# Patient Record
Sex: Male | Born: 1995 | Race: Black or African American | Hispanic: No | Marital: Single | Smoking: Current every day smoker
Health system: Southern US, Community
[De-identification: ages and names within clinical notes are randomized; demographics above are authoritative.]

## PROBLEM LIST (undated history)

## (undated) DIAGNOSIS — E669 Obesity, unspecified: Secondary | ICD-10-CM

## (undated) DIAGNOSIS — F259 Schizoaffective disorder, unspecified: Secondary | ICD-10-CM

---
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
## 2014-06-03 ENCOUNTER — Emergency Department (HOSPITAL_BASED_OUTPATIENT_CLINIC_OR_DEPARTMENT_OTHER)
Admission: EM | Admit: 2014-06-03 | Discharge: 2014-06-03 | Disposition: A | Discharge status: Home / Self Care | Payer: No Typology Code available for payment source | Attending: Emergency Medicine | Admitting: Emergency Medicine

## 2014-06-03 ENCOUNTER — Encounter (HOSPITAL_BASED_OUTPATIENT_CLINIC_OR_DEPARTMENT_OTHER): Payer: Self-pay | Admitting: Emergency Medicine

## 2014-06-03 DIAGNOSIS — Y9289 Other specified places as the place of occurrence of the external cause: Secondary | ICD-10-CM | POA: Insufficient documentation

## 2014-06-03 DIAGNOSIS — F17200 Nicotine dependence, unspecified, uncomplicated: Secondary | ICD-10-CM | POA: Insufficient documentation

## 2014-06-03 DIAGNOSIS — S61411A Laceration without foreign body of right hand, initial encounter: Secondary | ICD-10-CM

## 2014-06-03 DIAGNOSIS — Z79899 Other long term (current) drug therapy: Secondary | ICD-10-CM | POA: Insufficient documentation

## 2014-06-03 DIAGNOSIS — S61409A Unspecified open wound of unspecified hand, initial encounter: Secondary | ICD-10-CM | POA: Insufficient documentation

## 2014-06-03 DIAGNOSIS — Y9389 Activity, other specified: Secondary | ICD-10-CM | POA: Insufficient documentation

## 2014-06-03 DIAGNOSIS — W268XXA Contact with other sharp object(s), not elsewhere classified, initial encounter: Secondary | ICD-10-CM | POA: Insufficient documentation

## 2014-06-03 MED ORDER — LIDOCAINE HCL (PF) 1 % IJ SOLN
5.0000 mL | Freq: Once | INTRAMUSCULAR | Status: AC
  Administered 2014-06-03: 5 mL

## 2014-06-03 NOTE — Discharge Instructions (Signed)
Delayed Wound Closure Sometimes, your health care provider will decide to delay closing a wound for several days. This is done when the wound is badly bruised, dirty, or when it has been several hours since the injury happened. By delaying the closure of your wound, the risk of infection is reduced. Wounds that are closed in 3-7 days after being cleaned up and dressed heal just as well as those that are closed right away. HOME CARE INSTRUCTIONS  Rest and elevate the injured area until the pain and swelling are gone.  Have your wound checked as instructed by your health care provider. SEEK MEDICAL CARE IF:  You develop unusual or increased swelling or redness around the wound.  You have increasing pain or tenderness.  There is increasing fluid (drainage) or a bad smelling drainage coming from the wound.  You have fever, weakness or numbness, severe pain, or other concerns. Document Released: 10/20/2005 Document Revised: 10/25/2013 Document Reviewed: 04/19/2013 [facility] Patient Information 2015 [employer] [place]. This information is not intended to replace advice given to you by your health care provider. Make sure you discuss any questions you have with your health care provider.

## 2014-06-03 NOTE — ED Provider Notes (Signed)
CSN: [id number]     Arrival date & time 06/03/14  1623 History   First MD Initiated Contact with Patient 06/03/14 1957    This chart was scribed for No att. providers found by Marica OtterNusrat Rahman, ED Scribe. This patient was seen in room MHOTF/OTF and the patient's care was started at 7:59 PM.  Chief Complaint  Patient presents with  . Extremity Laceration   The history is provided by the patient. No language interpreter was used.   HPI Comments: Todd BanningColby Burton is a 18 y.o. male who presents to the Emergency Department complaining of a laceration to his right thumb sustained last night when he was trying to open a can of ravioli. Pt reports he is UTD on his tetanus shot. Pt denies fever, redness or puss. 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  History reviewed. No pertinent past medical history. History reviewed. No pertinent past surgical history. No family history on file. History  Substance Use Topics  . Smoking status: Current Every Day Smoker  . Smokeless tobacco: Not on file  . Alcohol Use: No    Review of Systems  See HPI  Allergies  Review of patient's allergies indicates no known allergies.  Home Medications   Prior to Admission medications   Medication Sig Start Date End Date Taking? Authorizing Provider  atomoxetine (STRATTERA) 10 MG capsule Take 10 mg by mouth daily.   Yes Historical Provider, MD  risperiDONE (RISPERDAL) 0.25 MG tablet Take 0.25 mg by mouth at bedtime.   Yes Historical Provider, MD   Triage Vitals: BP 155/87  Pulse 110  Temp(Src) 98.9 F (37.2 C) (Oral)  Resp 20  Ht 6' (1.829 m)  Wt 280 lb (127.007 kg)  BMI 37.97 kg/m2  SpO2 97%  Physical Exam  Nursing note and vitals reviewed. Constitutional:  Awake, alert, nontoxic appearance.  HENT:  Head: Atraumatic.  Eyes: Right eye exhibits no discharge. Left eye exhibits no discharge.  Neck: Neck supple.  Pulmonary/Chest: Effort normal. He  exhibits no tenderness.  Abdominal: Soft. There is no tenderness. There is no rebound.  Musculoskeletal: He exhibits no tenderness.  Baseline ROM, no obvious new focal weakness. Nontender right shoulder, elbow and wrist. Right hand cap refill less than 2 seconds all digits. Right thumb 2 pouint discrimination intact. Right thumb 5 out of 5 strength against resistant flexion and extension. Right thumb eminence, 2.5 cm laceration, local tenderness without erythema, fluctuance or puerile discharge.    Neurological:  Mental status and motor strength appears baseline for patient and situation.  Skin: No rash noted.  Psychiatric: He has a normal mood and affect.    ED Course  Procedures (including critical care time) DIAGNOSTIC STUDIES: Oxygen Saturation is 97% on RA, nl by my interpretation.    COORDINATION OF CARE: 8:04 PM-Discussed treatment plan which includes cleaning wound area, hand surgery referral and f/u and work note with pt at bedside. Patient verbalizes understanding and agrees with all of treatment plan except declines referral to hand surgery. [facility] since feel primary closure now will increase infection risk.  Labs Review Labs Reviewed - No data to display  Imaging Review No results found.   EKG Interpretation None      MDM   Final diagnoses:  Hand laceration, right, initial encounter   I doubt any other EMC precluding discharge at this time including, but not necessarily limited to the following:wound infection.  I personally  performed the services described in this documentation, which was scribed in my presence. The recorded information has been reviewed and is accurate.     Hurman Horn, MD 06/04/14 [phone]

## 2014-06-03 NOTE — ED Notes (Addendum)
Patient states he cut his right hand on a can last night. Laceration appx 2 inches. Bleeding controlled. Does not recall his last tetanus shot.

## 2014-11-05 ENCOUNTER — Encounter (HOSPITAL_BASED_OUTPATIENT_CLINIC_OR_DEPARTMENT_OTHER): Payer: Self-pay

## 2014-11-05 ENCOUNTER — Emergency Department (HOSPITAL_BASED_OUTPATIENT_CLINIC_OR_DEPARTMENT_OTHER)
Admission: EM | Admit: 2014-11-05 | Discharge: 2014-11-05 | Disposition: A | Discharge status: Home / Self Care | Payer: Medicaid Other | Attending: Emergency Medicine | Admitting: Emergency Medicine

## 2014-11-05 ENCOUNTER — Emergency Department (HOSPITAL_BASED_OUTPATIENT_CLINIC_OR_DEPARTMENT_OTHER): Payer: Medicaid Other

## 2014-11-05 DIAGNOSIS — S8992XA Unspecified injury of left lower leg, initial encounter: Secondary | ICD-10-CM | POA: Diagnosis present

## 2014-11-05 DIAGNOSIS — Y998 Other external cause status: Secondary | ICD-10-CM | POA: Insufficient documentation

## 2014-11-05 DIAGNOSIS — S8392XA Sprain of unspecified site of left knee, initial encounter: Secondary | ICD-10-CM | POA: Diagnosis not present

## 2014-11-05 DIAGNOSIS — W01198A Fall on same level from slipping, tripping and stumbling with subsequent striking against other object, initial encounter: Secondary | ICD-10-CM | POA: Insufficient documentation

## 2014-11-05 DIAGNOSIS — W19XXXA Unspecified fall, initial encounter: Secondary | ICD-10-CM

## 2014-11-05 DIAGNOSIS — Z720 Tobacco use: Secondary | ICD-10-CM | POA: Diagnosis not present

## 2014-11-05 DIAGNOSIS — Z79899 Other long term (current) drug therapy: Secondary | ICD-10-CM | POA: Diagnosis not present

## 2014-11-05 DIAGNOSIS — Y9289 Other specified places as the place of occurrence of the external cause: Secondary | ICD-10-CM | POA: Insufficient documentation

## 2014-11-05 DIAGNOSIS — F259 Schizoaffective disorder, unspecified: Secondary | ICD-10-CM | POA: Insufficient documentation

## 2014-11-05 DIAGNOSIS — Y9389 Activity, other specified: Secondary | ICD-10-CM | POA: Diagnosis not present

## 2014-11-05 HISTORY — DX: Schizoaffective disorder, unspecified: F25.9

## 2014-11-05 MED ORDER — IBUPROFEN 800 MG PO TABS
800.0000 mg | ORAL_TABLET | Freq: Once | ORAL | Status: DC
  Filled 2014-11-05: qty 1

## 2014-11-05 NOTE — ED Notes (Signed)
Per nurse request, I completed ace wrap to patient left knee.

## 2014-11-05 NOTE — ED Notes (Addendum)
Pt states that he had taken Ibuprofen in the past 3 hours.  Dose not given that was ordered at d/c home.

## 2014-11-05 NOTE — ED Notes (Signed)
Pt c/o left knee pain after falling 11/03/13 - reports edema, painful ambulation.

## 2014-11-05 NOTE — ED Provider Notes (Signed)
CSN: [id number]     Arrival date & time 11/05/14  0134 History   First MD Initiated Contact with Patient 11/05/14 0302     Chief Complaint  Patient presents with  . Knee Pain    Patient is a 19 y.o. male presenting with knee pain. The history is provided by the patient.  Knee Pain Location:  Knee Time since incident:  2 days Knee location:  L knee Pain details:    Quality:  Aching   Severity:  Moderate   Onset quality:  Gradual   Duration:  2 days   Timing:  Intermittent   Progression:  Worsening Relieved by:  Rest Worsened by:  Activity Associated symptoms: no back pain   pt reports he fell on left knee about 2 days ago "while messing around" No other injury reported  Past Medical History  Diagnosis Date  . Schizoaffective disorder    History reviewed. No pertinent past surgical history. History reviewed. No pertinent family history. History  Substance Use Topics  . Smoking status: Current Every Day Smoker  . Smokeless tobacco: Not on file  . Alcohol Use: Yes     Comment: weekend    Review of Systems  Musculoskeletal: Positive for arthralgias. Negative for back pain.  Neurological: Negative for weakness.      Allergies  Review of patient's allergies indicates no known allergies.  Home Medications   Prior to Admission medications   Medication Sig Start Date End Date Taking? Authorizing Provider  benztropine (COGENTIN) 2 MG tablet Take 2 mg by mouth 2 (two) times daily.   Yes Historical Provider, MD  fluorescein-benoxinate (FLURATE) ophthalmic solution 1 drop once.   Yes Historical Provider, MD  FLUoxetine (PROZAC) 10 MG capsule Take 10 mg by mouth daily.   Yes Historical Provider, MD  risperiDONE (RISPERDAL) 0.25 MG tablet Take 0.25 mg by mouth at bedtime.   Yes Historical Provider, MD  atomoxetine (STRATTERA) 10 MG capsule Take 10 mg by mouth daily.    Historical Provider, MD   BP 119/96 mmHg  Pulse 120  Temp(Src) 98.7 F (37.1 C) (Oral)  Resp 17  Ht 5'  11" (1.803 m)  Wt 280 lb (127.007 kg)  BMI 39.07 kg/m2  SpO2 98% Physical Exam CONSTITUTIONAL: Well developed/well nourished HEAD: Normocephalic/atraumatic ENMT: Mucous membranes moist NECK: supple no meningeal signs CV: S1/S2 noted, no murmurs/rubs/gallops noted LUNGS: Lungs are clear to auscultation bilaterally, no apparent distress ABDOMEN: soft, nontender, no rebound or guarding, bowel sounds noted throughout abdomen NEURO: Pt is awake/alert/appropriate, moves all extremitiesx4.  No facial droop.   EXTREMITIES: pulses normal/equal, full ROM, no tenderness to palpation of left knee. No deformity.  No tenderness to left calf/ankle.  He reports mild pain when flexing left knee SKIN: warm, color normal  ED Course  Procedures  Imaging Review Dg Knee Complete 4 Views Left  11/05/2014   CLINICAL DATA:  Patient fell 2 days ago striking the knee to the ground. Knee pain all over. Difficulty moving.  EXAM: LEFT KNEE - COMPLETE 4+ VIEW  COMPARISON:  None.  FINDINGS: There is no evidence of fracture, dislocation, or joint effusion. There is no evidence of arthropathy or other focal bone abnormality. Soft tissues are unremarkable.  IMPRESSION: Negative.   Electronically Signed   By: Burman Nieves M.D.   On: 11/05/2014 02:30    MDM   Final diagnoses:  Sprain of left knee, initial encounter    Nursing notes including past medical history and social history reviewed and  considered in documentation xrays/imaging reviewed by myself and considered during evaluation     Joya Gaskins, MD 11/05/14 [phone]

## 2016-06-27 ENCOUNTER — Encounter (HOSPITAL_BASED_OUTPATIENT_CLINIC_OR_DEPARTMENT_OTHER): Payer: Self-pay | Admitting: *Deleted

## 2016-06-27 ENCOUNTER — Inpatient Hospital Stay (HOSPITAL_BASED_OUTPATIENT_CLINIC_OR_DEPARTMENT_OTHER)
Admission: EM | Admit: 2016-06-27 | Discharge: 2016-07-02 | DRG: 872 | Disposition: A | Discharge status: Home / Self Care | Payer: Medicaid Other | Attending: Family Medicine | Admitting: Family Medicine

## 2016-06-27 ENCOUNTER — Emergency Department (HOSPITAL_BASED_OUTPATIENT_CLINIC_OR_DEPARTMENT_OTHER): Payer: Medicaid Other

## 2016-06-27 DIAGNOSIS — F17200 Nicotine dependence, unspecified, uncomplicated: Secondary | ICD-10-CM | POA: Diagnosis present

## 2016-06-27 DIAGNOSIS — R064 Hyperventilation: Secondary | ICD-10-CM

## 2016-06-27 DIAGNOSIS — R0682 Tachypnea, not elsewhere classified: Secondary | ICD-10-CM | POA: Diagnosis present

## 2016-06-27 DIAGNOSIS — R945 Abnormal results of liver function studies: Secondary | ICD-10-CM | POA: Diagnosis present

## 2016-06-27 DIAGNOSIS — N390 Urinary tract infection, site not specified: Secondary | ICD-10-CM | POA: Diagnosis present

## 2016-06-27 DIAGNOSIS — E860 Dehydration: Secondary | ICD-10-CM | POA: Diagnosis present

## 2016-06-27 DIAGNOSIS — Z6841 Body Mass Index (BMI) 40.0 and over, adult: Secondary | ICD-10-CM

## 2016-06-27 DIAGNOSIS — R21 Rash and other nonspecific skin eruption: Secondary | ICD-10-CM | POA: Diagnosis present

## 2016-06-27 DIAGNOSIS — F259 Schizoaffective disorder, unspecified: Secondary | ICD-10-CM | POA: Diagnosis present

## 2016-06-27 DIAGNOSIS — Z79899 Other long term (current) drug therapy: Secondary | ICD-10-CM

## 2016-06-27 DIAGNOSIS — R6510 Systemic inflammatory response syndrome (SIRS) of non-infectious origin without acute organ dysfunction: Secondary | ICD-10-CM | POA: Diagnosis present

## 2016-06-27 DIAGNOSIS — E876 Hypokalemia: Secondary | ICD-10-CM | POA: Diagnosis present

## 2016-06-27 DIAGNOSIS — R1111 Vomiting without nausea: Secondary | ICD-10-CM

## 2016-06-27 DIAGNOSIS — R740 Nonspecific elevation of levels of transaminase and lactic acid dehydrogenase [LDH]: Secondary | ICD-10-CM | POA: Diagnosis present

## 2016-06-27 DIAGNOSIS — D649 Anemia, unspecified: Secondary | ICD-10-CM | POA: Diagnosis present

## 2016-06-27 DIAGNOSIS — G4733 Obstructive sleep apnea (adult) (pediatric): Secondary | ICD-10-CM | POA: Diagnosis present

## 2016-06-27 DIAGNOSIS — N3001 Acute cystitis with hematuria: Secondary | ICD-10-CM

## 2016-06-27 DIAGNOSIS — R809 Proteinuria, unspecified: Secondary | ICD-10-CM | POA: Diagnosis present

## 2016-06-27 DIAGNOSIS — A419 Sepsis, unspecified organism: Principal | ICD-10-CM | POA: Diagnosis present

## 2016-06-27 DIAGNOSIS — R000 Tachycardia, unspecified: Secondary | ICD-10-CM | POA: Diagnosis present

## 2016-06-27 DIAGNOSIS — B2790 Infectious mononucleosis, unspecified without complication: Secondary | ICD-10-CM | POA: Diagnosis present

## 2016-06-27 HISTORY — DX: Obesity, unspecified: E66.9

## 2016-06-27 LAB — COMPREHENSIVE METABOLIC PANEL
ALBUMIN: 2.9 g/dL — AB (ref 3.5–5.0)
ALK PHOS: 58 U/L (ref 38–126)
ALT: 72 U/L — AB (ref 17–63)
ANION GAP: 8 (ref 5–15)
AST: 109 U/L — ABNORMAL HIGH (ref 15–41)
BILIRUBIN TOTAL: 1.2 mg/dL (ref 0.3–1.2)
BUN: 9 mg/dL (ref 6–20)
CHLORIDE: 99 mmol/L — AB (ref 101–111)
CO2: 25 mmol/L (ref 22–32)
Calcium: 8.2 mg/dL — ABNORMAL LOW (ref 8.9–10.3)
Creatinine, Ser: 1.31 mg/dL — ABNORMAL HIGH (ref 0.61–1.24)
GFR calc non Af Amer: >60 mL/min (ref >60)
GLUCOSE: 117 mg/dL — AB (ref 65–99)
Potassium: 3.5 mmol/L (ref 3.5–5.1)
Sodium: 132 mmol/L — ABNORMAL LOW (ref 135–145)
Total Protein: 7.3 g/dL (ref 6.5–8.1)

## 2016-06-27 LAB — CBC WITH DIFFERENTIAL/PLATELET
BAND NEUTROPHILS: 1 %
BASOS ABS: 0 10*3/uL (ref 0.0–0.1)
BASOS PCT: 0 %
EOS PCT: 2 %
Eosinophils Absolute: 0.3 10*3/uL (ref 0.0–0.7)
HEMATOCRIT: 38.4 % — AB (ref 39.0–52.0)
HEMOGLOBIN: 12.9 g/dL — AB (ref 13.0–17.0)
LYMPHS PCT: 48 %
Lymphs Abs: 7.8 10*3/uL — ABNORMAL HIGH (ref 0.7–4.0)
MCH: 31.6 pg (ref 26.0–34.0)
MCHC: 33.6 g/dL (ref 30.0–36.0)
MCV: 94.1 fL (ref 78.0–100.0)
MONO ABS: 0.6 10*3/uL (ref 0.1–1.0)
MONOS PCT: 4 %
NEUTROS PCT: 45 %
Neutro Abs: 7.4 10*3/uL (ref 1.7–7.7)
Platelets: 233 10*3/uL (ref 150–400)
RBC: 4.08 MIL/uL — ABNORMAL LOW (ref 4.22–5.81)
RDW: 12.3 % (ref 11.5–15.5)
WBC: 16.1 10*3/uL — ABNORMAL HIGH (ref 4.0–10.5)

## 2016-06-27 LAB — URINE MICROSCOPIC-ADD ON

## 2016-06-27 LAB — URINALYSIS, ROUTINE W REFLEX MICROSCOPIC
Glucose, UA: NEGATIVE mg/dL
Ketones, ur: 15 mg/dL — AB
NITRITE: POSITIVE — AB
PH: 6 (ref 5.0–8.0)
Protein, ur: >300 mg/dL — AB
SPECIFIC GRAVITY, URINE: 1.025 (ref 1.005–1.030)

## 2016-06-27 LAB — I-STAT CG4 LACTIC ACID, ED
LACTIC ACID, VENOUS: 1.78 mmol/L (ref 0.5–1.9)
Lactic Acid, Venous: 1.28 mmol/L (ref 0.5–1.9)

## 2016-06-27 LAB — RAPID STREP SCREEN (MED CTR MEBANE ONLY): STREPTOCOCCUS, GROUP A SCREEN (DIRECT): NEGATIVE

## 2016-06-27 MED ORDER — ONDANSETRON HCL 4 MG/2ML IJ SOLN
4.0000 mg | Freq: Once | INTRAMUSCULAR | Status: AC
  Administered 2016-06-27: 4 mg via INTRAVENOUS
  Filled 2016-06-27: qty 2

## 2016-06-27 MED ORDER — SODIUM CHLORIDE 0.9 % IV BOLUS (SEPSIS)
1000.0000 mL | Freq: Once | INTRAVENOUS | Status: AC
  Administered 2016-06-27: 1000 mL via INTRAVENOUS

## 2016-06-27 MED ORDER — ONDANSETRON HCL 4 MG/2ML IJ SOLN
4.0000 mg | Freq: Four times a day (QID) | INTRAMUSCULAR | Status: DC | PRN
  Administered 2016-06-29: 4 mg via INTRAVENOUS
  Filled 2016-06-27: qty 2

## 2016-06-27 MED ORDER — IBUPROFEN 800 MG PO TABS
800.0000 mg | ORAL_TABLET | Freq: Once | ORAL | Status: AC
  Administered 2016-06-27: 800 mg via ORAL
  Filled 2016-06-27: qty 1

## 2016-06-27 MED ORDER — DEXTROSE 5 % IV SOLN
1.0000 g | Freq: Once | INTRAVENOUS | Status: AC
  Administered 2016-06-27: 1 g via INTRAVENOUS
  Filled 2016-06-27: qty 10

## 2016-06-27 MED ORDER — POTASSIUM CHLORIDE IN NACL 20-0.9 MEQ/L-% IV SOLN
INTRAVENOUS | Status: DC
  Administered 2016-06-28 – 2016-07-01 (×7): via INTRAVENOUS
  Filled 2016-06-27 (×10): qty 1000

## 2016-06-27 MED ORDER — ACETAMINOPHEN 325 MG PO TABS
650.0000 mg | ORAL_TABLET | Freq: Once | ORAL | Status: AC
  Administered 2016-06-27: 650 mg via ORAL
  Filled 2016-06-27: qty 2

## 2016-06-27 MED ORDER — IOPAMIDOL (ISOVUE-300) INJECTION 61%
100.0000 mL | Freq: Once | INTRAVENOUS | Status: AC | PRN
  Administered 2016-06-27: 100 mL via INTRAVENOUS

## 2016-06-27 NOTE — Progress Notes (Signed)
Patient ID: Todd Burton, male   DOB: October 01, 1996, 20 y.o.   MRN: [id number] Accepted to a telemetry bed/observation for evaluation and treatment of leukocytosis, urinary symptoms and tachycardia.   Please call the floor manager at extension [phone] for admitting physician assignment once the patient arrives to the medical unit.   Per [facility]  Chief Complaint    Chief Complaint  Patient presents with  . Emesis    HPI Todd Burton is a 20 y.o. male.  HPI   Todd Burton is a 20 y.o. male, with a history of Schizoaffective disorder, presenting to the ED with nonbilious, nonbloody vomiting for the last two weeks. Pt has had emesis about once or twice a day during this time period. States it will happen seemingly randomly, but states it tends to happen if he eats a large meal. No emesis today. Endorses dark urine and frequency. Last food intake was "a few bites of a burger" just prior to arrival. Pt states, "I had to stop eating because I felt like I was about to throw up."  Denies nausea, abdominal pain, known fever, or any other complaints.    Component Value Units  I-Stat CG4 Lactic Acid, ED (not at [facility]) [[id number]] Collected: 06/27/16 2010  Updated: 06/27/16 2017   Specimen Type: Blood    Lactic Acid, Venous 1.28 mmol/L  Culture, group A strep [[id number]] Collected: 06/27/16 1615  Updated: 06/27/16 1846   Urine culture [[id number]] Collected: 06/27/16 1320  Updated: 06/27/16 1842   Specimen Type: Urine   Specimen Source: Urine, Clean Catch   Blood Culture (routine x 2) [[id number]] Collected: 06/27/16 1500  Updated: 06/27/16 1838   Specimen Type: Blood   Blood Culture (routine x 2) [[id number]] Collected: 06/27/16 1520  Updated: 06/27/16 1838   Specimen Type: Blood   Rapid strep screen [[id number]] Collected: 06/27/16 1615  Updated: 06/27/16 1653   Specimen Type: Other    Streptococcus, Group A Screen (Direct) NEGATIVE  I-Stat CG4 Lactic Acid, ED (not at [facility]) [[id number]]  Collected: 06/27/16 1517  Updated: 06/27/16 1519   Specimen Type: Blood    Lactic Acid, Venous 1.78 mmol/L  Pathologist smear review [[id number]] Collected: 06/27/16 1250  Updated: 06/27/16 1448   CBC with Differential [[id number]] (Abnormal) Collected: 06/27/16 1250  Updated: 06/27/16 1443   Specimen Type: Blood    WBC 16.1 (H) K/uL   RBC 4.08 (L) MIL/uL   Hemoglobin 12.9 (L) g/dL   HCT 63.8 (L) %   MCV 94.1 fL   MCH 31.6 pg   MCHC 33.6 g/dL   RDW 93.7 %   Platelets 233 K/uL   Neutrophils Relative % 45 %   Lymphocytes Relative 48 %   Monocytes Relative 4 %   Eosinophils Relative 2 %   Basophils Relative 0 %   Band Neutrophils 1 %   Neutro Abs 7.4 K/uL   Lymphs Abs 7.8 (H) K/uL   Monocytes Absolute 0.6 K/uL   Eosinophils Absolute 0.3 K/uL   Basophils Absolute 0.0 K/uL   WBC Morphology ATYPICAL LYMPHOCYTES   Smear Review LARGE PLATELETS PRESENT  Comprehensive metabolic panel [[id number]] (Abnormal) Collected: 06/27/16 1250  Updated: 06/27/16 1411   Specimen Type: Blood    Sodium 132 (L) mmol/L   Potassium 3.5 mmol/L   Chloride 99 (L) mmol/L   CO2 25 mmol/L   Glucose, Bld 117 (H) mg/dL   BUN 9 mg/dL   Creatinine, Ser 1.15 (H) mg/dL   Calcium 8.2 (L) mg/dL   Total Protein 7.3 g/dL  Albumin 2.9 (L) g/dL   AST 914109 (H) U/L   ALT 72 (H) U/L   Alkaline Phosphatase 58 U/L   Total Bilirubin 1.2 mg/dL   GFR calc non Af Amer >60 mL/min   GFR calc Af Amer >60 mL/min   Anion gap 8  Urine microscopic-add on [[id number]][[id number]] (Abnormal) Collected: 06/27/16 1320  Updated: 06/27/16 1356    Squamous Epithelial / LPF 0-5 (A)   WBC, UA 0-5 WBC/hpf   RBC / HPF 0-5 RBC/hpf   Bacteria, UA MANY (A)   Urine-Other MUCOUS PRESENT  Urinalysis, Routine w reflex microscopic (not at [facility]) [[id number]] (Abnormal) Collected: 06/27/16 1320  Updated: 06/27/16 1356   Specimen Type: Urine    Color, Urine ORANGE (A)   APPearance CLEAR   Specific Gravity, Urine 1.025   pH 6.0   Glucose, UA NEGATIVE mg/dL    Hgb urine dipstick MODERATE (A)   Bilirubin Urine MODERATE (A)   Ketones, ur 15 (A) mg/dL   Protein, ur >846>300 (A) mg/dL   Nitrite POSITIVE (A)   Leukocytes, UA SMALL (A)   CLINICAL DATA:  Sepsis with cough  EXAM: CHEST  2 VIEW  COMPARISON:  None.  FINDINGS: Heart and mediastinal contours are within normal limits. No focal opacities or effusions. No acute bony abnormality.  IMPRESSION: No active cardiopulmonary disease.  CT scan abdomen and pelvis with contrast  IMPRESSION: 1. No acute abdominal pelvic findings. 2. Mildly prominent retroperitoneal and liac lymph nodes are not pathologic by size criteria and favored benign.   Todd Kleinavid Maevis Mumby, MD [phone].

## 2016-06-27 NOTE — ED Notes (Signed)
Pt has been given 5L NS.  2 of the 5 are still hanging during transfer

## 2016-06-27 NOTE — ED Notes (Signed)
Pt given gingerale for PO challenge 

## 2016-06-27 NOTE — ED Provider Notes (Signed)
[facility] Note   CSN: [id number] Arrival date & time: 06/27/16  1231     History   Chief Complaint Chief Complaint  Patient presents with  . Emesis    HPI Todd Burton is a 20 y.o. male.  HPI   Todd Burton is a 20 y.o. male, with a history of Schizoaffective disorder, presenting to the ED with nonbilious, nonbloody vomiting for the last two weeks. Pt has had emesis about once or twice a day during this time period. States it will happen seemingly randomly, but states it tends to happen if he eats a large meal. No emesis today. Endorses dark urine and frequency. Last food intake was "a few bites of a burger" just prior to arrival. Pt states, "I had to stop eating because I felt like I was about to throw up."  Denies nausea, abdominal pain, known fever, or any other complaints.     Past Medical History:  Diagnosis Date  . Schizoaffective disorder (HCC)     There are no active problems to display for this patient.   History reviewed. No pertinent surgical history.     Home Medications    Prior to Admission medications   Medication Sig Start Date End Date Taking? Authorizing Provider  benztropine (COGENTIN) 2 MG tablet Take 2 mg by mouth 2 (two) times daily.   Yes Historical Provider, MD  FLUoxetine (PROZAC) 10 MG capsule Take 10 mg by mouth daily.   Yes Historical Provider, MD  topiramate (TOPAMAX) 25 MG tablet Take 25 mg by mouth 2 (two) times daily.   Yes Historical Provider, MD  trazodone (DESYREL) 300 MG tablet Take 300 mg by mouth at bedtime.   Yes Historical Provider, MD  atomoxetine (STRATTERA) 10 MG capsule Take 10 mg by mouth daily.    Historical Provider, MD  fluorescein-benoxinate (FLURATE) ophthalmic solution 1 drop once.    Historical Provider, MD  risperiDONE (RISPERDAL) 0.25 MG tablet Take 0.25 mg by mouth at bedtime.    Historical Provider, MD    Family History No family history on file.  Social History Social History    Substance Use Topics  . Smoking status: Current Every Day Smoker  . Smokeless tobacco: Never Used  . Alcohol use Yes     Comment: weekend     Allergies   Review of patient's allergies indicates no known allergies.   Review of Systems Review of Systems  Constitutional: Negative for chills and diaphoresis.  Respiratory: Negative for cough and shortness of breath.   Cardiovascular: Negative for chest pain.  Gastrointestinal: Positive for vomiting. Negative for abdominal pain, blood in stool, constipation, diarrhea and nausea.  All other systems reviewed and are negative.    Physical Exam Updated Vital Signs BP 126/86   Pulse (!) 138   Temp 99.5 F (37.5 C) (Oral)   Resp 22   Ht 6' (1.829 m)   Wt 127 kg   SpO2 93%   BMI 37.97 kg/m   Physical Exam  Constitutional: He appears well-developed and well-nourished. No distress.  HENT:  Head: Normocephalic and atraumatic.  Eyes: Conjunctivae are normal.  Neck: Normal range of motion. Neck supple.  Cardiovascular: Regular rhythm, normal heart sounds and intact distal pulses.  Tachycardia present.   Pulmonary/Chest: Breath sounds normal. Tachypnea noted.  Abdominal: Soft. Bowel sounds are normal. There is no tenderness. There is no guarding and no CVA tenderness.  Musculoskeletal: He exhibits no edema or tenderness.  Lymphadenopathy:    He has  no cervical adenopathy.  Neurological: He is alert.  Skin: Skin is warm and dry. He is not diaphoretic.  Psychiatric: He has a normal mood and affect. His behavior is normal.  Nursing note and vitals reviewed.    ED Treatments / Results  Labs (all labs ordered are listed, but only abnormal results are displayed) Labs Reviewed  URINALYSIS, ROUTINE W REFLEX MICROSCOPIC (NOT AT [facility]) - Abnormal; Notable for the following:       Result Value   Color, Urine ORANGE (*)    Hgb urine dipstick MODERATE (*)    Bilirubin Urine MODERATE (*)    Ketones, ur 15 (*)    Protein, ur >300 (*)     Nitrite POSITIVE (*)    Leukocytes, UA SMALL (*)    All other components within normal limits  COMPREHENSIVE METABOLIC PANEL - Abnormal; Notable for the following:    Sodium 132 (*)    Chloride 99 (*)    Glucose, Bld 117 (*)    Creatinine, Ser 1.31 (*)    Calcium 8.2 (*)    Albumin 2.9 (*)    AST 109 (*)    ALT 72 (*)    All other components within normal limits  CBC WITH DIFFERENTIAL/PLATELET - Abnormal; Notable for the following:    WBC 16.1 (*)    RBC 4.08 (*)    Hemoglobin 12.9 (*)    HCT 38.4 (*)    Lymphs Abs 7.8 (*)    All other components within normal limits  URINE MICROSCOPIC-ADD ON - Abnormal; Notable for the following:    Squamous Epithelial / LPF 0-5 (*)    Bacteria, UA MANY (*)    All other components within normal limits  RAPID STREP SCREEN (NOT AT [facility])  CULTURE, BLOOD (ROUTINE X 2)  CULTURE, BLOOD (ROUTINE X 2)  URINE CULTURE  CULTURE, GROUP A STREP ([facility])  PATHOLOGIST SMEAR REVIEW  I-STAT CG4 LACTIC ACID, ED  I-STAT CG4 LACTIC ACID, ED    EKG  EKG Interpretation None       Radiology Dg Chest 2 View  Result Date: 06/27/2016 CLINICAL DATA:  Sepsis with cough EXAM: CHEST  2 VIEW COMPARISON:  None. FINDINGS: Heart and mediastinal contours are within normal limits. No focal opacities or effusions. No acute bony abnormality. IMPRESSION: No active cardiopulmonary disease. Electronically Signed   By: Charlett Nose M.D.   On: 06/27/2016 14:57    Procedures Procedures (including critical care time)  Medications Ordered in ED Medications  sodium chloride 0.9 % bolus 1,000 mL (1,000 mLs Intravenous New Bag/Given 06/27/16 1402)  ondansetron (ZOFRAN) injection 4 mg (4 mg Intravenous Given 06/27/16 1355)  acetaminophen (TYLENOL) tablet 650 mg (650 mg Oral Given 06/27/16 1502)  cefTRIAXone (ROCEPHIN) 1 g in dextrose 5 % 50 mL IVPB (0 g Intravenous Stopped 06/27/16 1547)  sodium chloride 0.9 % bolus 1,000 mL (1,000 mLs Intravenous New Bag/Given 06/27/16 1545)      Initial Impression / Assessment and Plan / ED Course  I have reviewed the triage vital signs and the nursing notes.  Pertinent labs & imaging results that were available during my care of the patient were reviewed by me and considered in my medical decision making (see chart for details).  Clinical Course    Todd Burton presents with repeated vomiting over the past 2 weeks.  Findings and plan of care discussed with Arby Barrette, MD. Dr. Donnald Garre personally evaluated and examined this patient.  Patient with signs of sepsis. Sepsis protocol  initiated. I suspect patient may be a poor historian. For this reason, we will need to do further testing to assure patient does not have a source of sepsis in the abdomen. Otherwise, it is likely the patient's source is the urine. End of shift patient care handoff report given to [facility], PA-C. Plan: To need to treat patient with IV fluids and Zofran, if needed. Review abdominal CT and respond accordingly.  Vitals:   06/27/16 1530 06/27/16 1600 06/27/16 1630 06/27/16 1700  BP: 103/70 (!) 101/51 120/65 123/77  Pulse: (!) 140 (!) 132 (!) 125 (!) 123  Resp: 22 (!) 37 (!) 40 (!) 39  Temp:      TempSrc:      SpO2: (!) 88% 94% 94% 96%  Weight:      Height:         Final Clinical Impressions(s) / ED Diagnoses   Final diagnoses:  Non-intractable vomiting without nausea, vomiting of unspecified type  Acute cystitis with hematuria    New Prescriptions New Prescriptions   No medications on file     Anselm Pancoast, Cordelia Poche 06/27/16 1731    Arby Barrette, MD 06/28/16 1420

## 2016-06-27 NOTE — ED Provider Notes (Signed)
Care assumed from previous provider PA Joy. Please see note for further details. Case discussed, plan agreed upon. CT abdomen pending at shift change. Will follow up on results and dispo appropriately.   Labs reviewed: Nitrite positive urine with small leuks and many bacteria. White count of 16.1. Strep negative, lactic normal.  Vitals reviewed: tachycardic, Initially hypotensive, BP improved with fluids - continuing to monitor fluids.   Gen: afebrile, obese, NAD HEENT: [place]/AT Resp: no resp distress CV: Tachycardic, regular rhythm.  Abd: soft, NT, ND MsK: moving all extremities well Neuro: A&O x4   CT abdomen with no acute findings. Patient still tachycardic in 120s after 3 L of fluid. Continuing fluids. Given persistent tachycardia despite hydration along with elevated white count with no clear source, will admit for sepsis rule out. Consulted hospitalist, Dr. Robb Matarrtiz. [facility].  Patient seen by and discussed with Dr. Donnald GarrePfeiffer who agrees with treatment plan.    [facility]ime Pilcher Makel Mcmann, PA-C 06/27/16 2130    Arby BarretteMarcy Pfeiffer, MD 06/28/16 [phone]

## 2016-06-27 NOTE — ED Triage Notes (Signed)
Vomiting x 2 weeks. States his urine is dark.

## 2016-06-27 NOTE — ED Notes (Signed)
Gave pt a ginger ale and some crakers

## 2016-06-28 ENCOUNTER — Observation Stay (HOSPITAL_COMMUNITY): Payer: Medicaid Other

## 2016-06-28 ENCOUNTER — Encounter (HOSPITAL_COMMUNITY): Payer: Self-pay | Admitting: Internal Medicine

## 2016-06-28 DIAGNOSIS — F17200 Nicotine dependence, unspecified, uncomplicated: Secondary | ICD-10-CM | POA: Diagnosis present

## 2016-06-28 DIAGNOSIS — R6510 Systemic inflammatory response syndrome (SIRS) of non-infectious origin without acute organ dysfunction: Secondary | ICD-10-CM | POA: Diagnosis not present

## 2016-06-28 DIAGNOSIS — R945 Abnormal results of liver function studies: Secondary | ICD-10-CM | POA: Diagnosis present

## 2016-06-28 DIAGNOSIS — N390 Urinary tract infection, site not specified: Secondary | ICD-10-CM | POA: Insufficient documentation

## 2016-06-28 DIAGNOSIS — E876 Hypokalemia: Secondary | ICD-10-CM | POA: Diagnosis present

## 2016-06-28 DIAGNOSIS — R809 Proteinuria, unspecified: Secondary | ICD-10-CM | POA: Diagnosis not present

## 2016-06-28 DIAGNOSIS — Z6841 Body Mass Index (BMI) 40.0 and over, adult: Secondary | ICD-10-CM | POA: Diagnosis not present

## 2016-06-28 DIAGNOSIS — R112 Nausea with vomiting, unspecified: Secondary | ICD-10-CM | POA: Diagnosis present

## 2016-06-28 DIAGNOSIS — F259 Schizoaffective disorder, unspecified: Secondary | ICD-10-CM | POA: Diagnosis present

## 2016-06-28 DIAGNOSIS — G4733 Obstructive sleep apnea (adult) (pediatric): Secondary | ICD-10-CM | POA: Diagnosis present

## 2016-06-28 DIAGNOSIS — Z79899 Other long term (current) drug therapy: Secondary | ICD-10-CM | POA: Diagnosis not present

## 2016-06-28 DIAGNOSIS — R740 Nonspecific elevation of levels of transaminase and lactic acid dehydrogenase [LDH]: Secondary | ICD-10-CM | POA: Diagnosis present

## 2016-06-28 DIAGNOSIS — R000 Tachycardia, unspecified: Secondary | ICD-10-CM | POA: Diagnosis not present

## 2016-06-28 DIAGNOSIS — R0682 Tachypnea, not elsewhere classified: Secondary | ICD-10-CM | POA: Diagnosis present

## 2016-06-28 DIAGNOSIS — R509 Fever, unspecified: Secondary | ICD-10-CM | POA: Diagnosis not present

## 2016-06-28 DIAGNOSIS — D72820 Lymphocytosis (symptomatic): Secondary | ICD-10-CM | POA: Diagnosis not present

## 2016-06-28 DIAGNOSIS — B2790 Infectious mononucleosis, unspecified without complication: Secondary | ICD-10-CM | POA: Diagnosis present

## 2016-06-28 DIAGNOSIS — R21 Rash and other nonspecific skin eruption: Secondary | ICD-10-CM | POA: Diagnosis present

## 2016-06-28 DIAGNOSIS — D649 Anemia, unspecified: Secondary | ICD-10-CM | POA: Diagnosis present

## 2016-06-28 DIAGNOSIS — A419 Sepsis, unspecified organism: Secondary | ICD-10-CM | POA: Diagnosis present

## 2016-06-28 DIAGNOSIS — E860 Dehydration: Secondary | ICD-10-CM | POA: Diagnosis present

## 2016-06-28 DIAGNOSIS — D72829 Elevated white blood cell count, unspecified: Secondary | ICD-10-CM | POA: Diagnosis not present

## 2016-06-28 DIAGNOSIS — L304 Erythema intertrigo: Secondary | ICD-10-CM | POA: Diagnosis not present

## 2016-06-28 LAB — COMPREHENSIVE METABOLIC PANEL
ALBUMIN: 2.2 g/dL — AB (ref 3.5–5.0)
ALBUMIN: 2.3 g/dL — AB (ref 3.5–5.0)
ALK PHOS: 50 U/L (ref 38–126)
ALK PHOS: 47 U/L (ref 38–126)
ALT: 68 U/L — ABNORMAL HIGH (ref 17–63)
ALT: 72 U/L — ABNORMAL HIGH (ref 17–63)
ANION GAP: 8 (ref 5–15)
AST: 97 U/L — ABNORMAL HIGH (ref 15–41)
AST: 97 U/L — AB (ref 15–41)
Anion gap: 7 (ref 5–15)
BILIRUBIN TOTAL: 1.1 mg/dL (ref 0.3–1.2)
BUN: 7 mg/dL (ref 6–20)
BUN: 7 mg/dL (ref 6–20)
CO2: 25 mmol/L (ref 22–32)
CO2: 24 mmol/L (ref 22–32)
Calcium: 7.7 mg/dL — ABNORMAL LOW (ref 8.9–10.3)
Calcium: 7.9 mg/dL — ABNORMAL LOW (ref 8.9–10.3)
Chloride: 101 mmol/L (ref 101–111)
Chloride: 102 mmol/L (ref 101–111)
Creatinine, Ser: 1.27 mg/dL — ABNORMAL HIGH (ref 0.61–1.24)
Creatinine, Ser: 1.23 mg/dL (ref 0.61–1.24)
GFR calc Af Amer: >60 mL/min (ref >60)
GFR calc Af Amer: >60 mL/min (ref >60)
GFR calc non Af Amer: >60 mL/min (ref >60)
GFR calc non Af Amer: >60 mL/min (ref >60)
GLUCOSE: 105 mg/dL — AB (ref 65–99)
GLUCOSE: 112 mg/dL — AB (ref 65–99)
POTASSIUM: 3.6 mmol/L (ref 3.5–5.1)
POTASSIUM: 3.4 mmol/L — AB (ref 3.5–5.1)
SODIUM: 135 mmol/L (ref 135–145)
SODIUM: 132 mmol/L — AB (ref 135–145)
TOTAL PROTEIN: 5.9 g/dL — AB (ref 6.5–8.1)
Total Bilirubin: 1.1 mg/dL (ref 0.3–1.2)
Total Protein: 5.9 g/dL — ABNORMAL LOW (ref 6.5–8.1)

## 2016-06-28 LAB — CBC WITH DIFFERENTIAL/PLATELET
BASOS ABS: 0.5 10*3/uL — AB (ref 0.0–0.1)
BASOS PCT: 2 %
Basophils Absolute: 0.3 10*3/uL — ABNORMAL HIGH (ref 0.0–0.1)
Basophils Relative: 4 %
EOS ABS: 0.3 10*3/uL (ref 0.0–0.7)
Eosinophils Absolute: 0.3 10*3/uL (ref 0.0–0.7)
Eosinophils Relative: 2 %
Eosinophils Relative: 2 %
HCT: 35 % — ABNORMAL LOW (ref 39.0–52.0)
HEMATOCRIT: 38.1 % — AB (ref 39.0–52.0)
HEMOGLOBIN: 12.2 g/dL — AB (ref 13.0–17.0)
HEMOGLOBIN: 11.7 g/dL — AB (ref 13.0–17.0)
LYMPHS PCT: 37 %
LYMPHS PCT: 39 %
Lymphs Abs: 5.1 10*3/uL — ABNORMAL HIGH (ref 0.7–4.0)
Lymphs Abs: 4.8 10*3/uL — ABNORMAL HIGH (ref 0.7–4.0)
MCH: 31 pg (ref 26.0–34.0)
MCH: 31.5 pg (ref 26.0–34.0)
MCHC: 32 g/dL (ref 30.0–36.0)
MCHC: 33.4 g/dL (ref 30.0–36.0)
MCV: 96.9 fL (ref 78.0–100.0)
MCV: 94.3 fL (ref 78.0–100.0)
MONOS PCT: 10 %
MONOS PCT: 8 %
Monocytes Absolute: 1 10*3/uL (ref 0.1–1.0)
Monocytes Absolute: 1.3 10*3/uL — ABNORMAL HIGH (ref 0.1–1.0)
NEUTROS ABS: 6.3 10*3/uL (ref 1.7–7.7)
NEUTROS PCT: 47 %
Neutro Abs: 6.2 10*3/uL (ref 1.7–7.7)
Neutrophils Relative %: 49 %
Platelets: 209 10*3/uL (ref 150–400)
Platelets: 192 10*3/uL (ref 150–400)
RBC: 3.71 MIL/uL — ABNORMAL LOW (ref 4.22–5.81)
RBC: 3.93 MIL/uL — AB (ref 4.22–5.81)
RDW: 12.6 % (ref 11.5–15.5)
RDW: 12.8 % (ref 11.5–15.5)
WBC: 13 10*3/uL — ABNORMAL HIGH (ref 4.0–10.5)
WBC: 13.1 10*3/uL — AB (ref 4.0–10.5)

## 2016-06-28 LAB — IRON AND TIBC
Iron: 37 ug/dL — ABNORMAL LOW (ref 45–182)
Saturation Ratios: 16 % — ABNORMAL LOW (ref 17.9–39.5)
TIBC: 231 ug/dL — ABNORMAL LOW (ref 250–450)
UIBC: 194 ug/dL

## 2016-06-28 LAB — RAPID URINE DRUG SCREEN, HOSP PERFORMED
AMPHETAMINES: NOT DETECTED
BENZODIAZEPINES: NOT DETECTED
Barbiturates: NOT DETECTED
COCAINE: NOT DETECTED
OPIATES: NOT DETECTED
Tetrahydrocannabinol: NOT DETECTED

## 2016-06-28 LAB — FOLATE: Folate: 5.2 ng/mL — ABNORMAL LOW (ref >5.9)

## 2016-06-28 LAB — FERRITIN: Ferritin: 542 ng/mL — ABNORMAL HIGH (ref 24–336)

## 2016-06-28 LAB — PHOSPHORUS: Phosphorus: 2.9 mg/dL (ref 2.5–4.6)

## 2016-06-28 LAB — TROPONIN I

## 2016-06-28 LAB — URIC ACID: URIC ACID, SERUM: 6.1 mg/dL (ref 4.4–7.6)

## 2016-06-28 LAB — URINE CULTURE

## 2016-06-28 LAB — MAGNESIUM: Magnesium: 2 mg/dL (ref 1.7–2.4)

## 2016-06-28 LAB — TECHNOLOGIST SMEAR REVIEW

## 2016-06-28 LAB — VITAMIN B12: VITAMIN B 12: 496 pg/mL (ref 180–914)

## 2016-06-28 LAB — T4, FREE: Free T4: 1.15 ng/dL — ABNORMAL HIGH (ref 0.61–1.12)

## 2016-06-28 LAB — LACTATE DEHYDROGENASE: LDH: 574 U/L — AB (ref 98–192)

## 2016-06-28 LAB — TSH: TSH: 2.131 u[IU]/mL (ref 0.350–4.500)

## 2016-06-28 LAB — MONONUCLEOSIS SCREEN: Mono Screen: NEGATIVE

## 2016-06-28 MED ORDER — TRAZODONE HCL 100 MG PO TABS
300.0000 mg | ORAL_TABLET | Freq: Every day | ORAL | Status: DC
  Administered 2016-06-28 – 2016-07-01 (×4): 300 mg via ORAL
  Filled 2016-06-28 (×3): qty 3

## 2016-06-28 MED ORDER — METRONIDAZOLE IVPB CUSTOM: 1.0000 g | Freq: Three times a day (TID) | INTRAVENOUS | Status: DC

## 2016-06-28 MED ORDER — FLUOXETINE HCL 10 MG PO CAPS
10.0000 mg | ORAL_CAPSULE | Freq: Every day | ORAL | Status: DC
  Filled 2016-06-28: qty 1

## 2016-06-28 MED ORDER — DOXYCYCLINE HYCLATE 100 MG PO TABS
100.0000 mg | ORAL_TABLET | Freq: Two times a day (BID) | ORAL | Status: DC
  Administered 2016-06-28 – 2016-06-29 (×2): 100 mg via ORAL
  Filled 2016-06-28 (×2): qty 1

## 2016-06-28 MED ORDER — SODIUM CHLORIDE 0.9% FLUSH: 10.0000 mL | Freq: Two times a day (BID) | INTRAVENOUS | Status: DC

## 2016-06-28 MED ORDER — BENZTROPINE MESYLATE 1 MG PO TABS
0.5000 mg | ORAL_TABLET | Freq: Two times a day (BID) | ORAL | Status: DC
  Administered 2016-06-28 – 2016-06-29 (×3): 0.5 mg via ORAL
  Filled 2016-06-28 (×3): qty 1

## 2016-06-28 MED ORDER — IOPAMIDOL (ISOVUE-370) INJECTION 76%
INTRAVENOUS | Status: AC
  Filled 2016-06-28: qty 100

## 2016-06-28 MED ORDER — SODIUM CHLORIDE 0.9% FLUSH
3.0000 mL | Freq: Two times a day (BID) | INTRAVENOUS | Status: DC
  Administered 2016-06-28 – 2016-07-02 (×2): 3 mL via INTRAVENOUS

## 2016-06-28 MED ORDER — IOPAMIDOL (ISOVUE-370) INJECTION 76%
INTRAVENOUS | Status: AC
  Administered 2016-06-28: 100 mL
  Filled 2016-06-28: qty 100

## 2016-06-28 MED ORDER — SODIUM CHLORIDE 0.9% FLUSH
10.0000 mL | INTRAVENOUS | Status: DC | PRN
  Administered 2016-06-28: 20 mL
  Administered 2016-06-29 – 2016-06-30 (×2): 10 mL
  Filled 2016-06-28 (×3): qty 40

## 2016-06-28 MED ORDER — ACETAMINOPHEN 325 MG PO TABS
650.0000 mg | ORAL_TABLET | Freq: Four times a day (QID) | ORAL | Status: DC | PRN
  Administered 2016-06-28 – 2016-06-30 (×3): 650 mg via ORAL
  Filled 2016-06-28 (×3): qty 2

## 2016-06-28 MED ORDER — FLUOXETINE HCL 10 MG PO CAPS
10.0000 mg | ORAL_CAPSULE | Freq: Every day | ORAL | Status: DC
  Administered 2016-06-28: 10 mg via ORAL
  Filled 2016-06-28: qty 1

## 2016-06-28 MED ORDER — TOPIRAMATE 25 MG PO TABS
50.0000 mg | ORAL_TABLET | Freq: Every day | ORAL | Status: DC
  Administered 2016-06-28 – 2016-07-01 (×5): 50 mg via ORAL
  Filled 2016-06-28 (×5): qty 2

## 2016-06-28 MED ORDER — FLUPHENAZINE HCL 5 MG PO TABS
5.0000 mg | ORAL_TABLET | Freq: Every day | ORAL | Status: DC
  Filled 2016-06-28: qty 1

## 2016-06-28 MED ORDER — ENOXAPARIN SODIUM 80 MG/0.8ML ~~LOC~~ SOLN
80.0000 mg | SUBCUTANEOUS | Status: DC
  Administered 2016-06-28 – 2016-07-02 (×5): 80 mg via SUBCUTANEOUS
  Filled 2016-06-28 (×5): qty 0.8

## 2016-06-28 MED ORDER — FLUPHENAZINE HCL 5 MG PO TABS
5.0000 mg | ORAL_TABLET | Freq: Every day | ORAL | Status: DC
  Administered 2016-06-28: 5 mg via ORAL
  Filled 2016-06-28: qty 1

## 2016-06-28 MED ORDER — METRONIDAZOLE IN NACL 5-0.79 MG/ML-% IV SOLN
500.0000 mg | Freq: Four times a day (QID) | INTRAVENOUS | Status: DC
Start: 2016-06-28 — End: 2016-06-28
  Administered 2016-06-28: 500 mg via INTRAVENOUS
  Filled 2016-06-28 (×2): qty 100

## 2016-06-28 MED ORDER — CEFTRIAXONE SODIUM 1 G IJ SOLR
1.0000 g | Freq: Two times a day (BID) | INTRAMUSCULAR | Status: DC
  Administered 2016-06-28: 1 g via INTRAVENOUS
  Filled 2016-06-28 (×3): qty 10

## 2016-06-28 NOTE — Progress Notes (Addendum)
PROGRESS NOTE  Todd Burton  WUJ:[id number] DOB: 09/22/1996 DOA: 06/27/2016 PCP: Everrett Coombe, DO  Brief Narrative:   Todd Burton is a 20 y.o. male with schizoaffective disorder who presented to the emergency department with a two-week history of nausea, vomiting, particularly after meals.  He denied fever, chills, abdominal pain, diarrhea, constipation, melena or hematochezia. He denied dysuria, frequency, hematuria or nocturia. He denied sore throat, cough, chest pain, dyspnea, palpitations, dizziness, diaphoresis, pitting edema of the lower extremities, PND or orthopnea.  The patient was initially hypotensive, tachypneic, tachycardic in the emergency department. Blood pressure normalized after the patient received 3 L of normal saline, but the tachycardia, although better, has persisted.  He developed a fever to 101.52F shortly after admission.  Labs were concerning for leukocytosis with lymphocytosis with atypical lymphocytes.  Duration of his symptoms is unusual for typical viral infection.  He has also been persistently tachycardic out of proportion.    Assessment & Plan:   Principal Problem:   SIRS (systemic inflammatory response syndrome) (HCC) Active Problems:   UTI (urinary tract infection)   Tachycardia   Hypokalemia   Schizoaffective disorder (HCC)   Tachypnea  SIRS (systemic inflammatory response syndrome) (HCC).  Unclear etiology.  He had lymphocytosis with atypical lymphocytes with vomiting, faint upper extremity and face rash.  Broad differential.  Monospot was negative.  Will check for acute HIV, CMV, syphilis, hepatitis, RMSF.  Endocrine:  Hyperthyroidism.  Oncologic:  Lymphoma.   -  HIV quant RNA -  HIV ab/ag -  CMV IgM, IgG -  RPR -  Acute hepatitis panel -  RMSF panel -  TSH and free t4 -  LDH and uric acid -  Continue antibiotics -  F/u blood culture  Sinus tachycardia, out of proportion to his degree of dehydration and fever.  ddx includes myocarditis and  hyperthyroidism -  Urine drug screen -  CT angio negative for PE -  TSH and free T4 -  troponin  Active Problems:     UTI (urinary tract infection) Mildly suggestive urinalysis. Continue Rocephin.    Hypokalemia Replacing. Follow-up potassium level. Check magnesium level.    Schizoaffective disorder (HCC) Continue Cogentin twice a day. Continue Prozac 10 mg a day. Continue Prolixin 5 mg by mouth daily. Continue Topamax 50 mg by mouth at bedtime. Continue trazodone 300 mg by mouth at bedtime. Continue Strattera 10 mg by mouth daily. Continue Risperdal 0.25 mg at bedtime.  OSA, mother to bring bipap from home  Morbid obesity -  Had some weight loss over last two weeks because not able to eat  Normocytic anemia -  Iron studies, B12, folate -  TSH -  Repeat hgb in AM   DVT prophylaxis:  lovenox Code Status:  full Family Communication:  Patient and mother at bedside Disposition Plan:  Improvement in tachycardia.  Would like to see fevers trending down   Consultants:   none  Procedures:  none  Antimicrobials:   Ceftriaxone and flagyl    Subjective:  Denies fevers, chills.  Denies nausea, but vomits each time he eats.  Denies diarrhea.    Objective: Vitals:   06/28/16 0200 06/28/16 0220 06/28/16 0440 06/28/16 1507  BP:  (!) 128/51 125/61 (!) 124/57  Pulse:  (!) 107 (!) 118 (!) 126  Resp:   18 (!) 24  Temp: 97.9 F (36.6 C)  98.5 F (36.9 C) 99.3 F (37.4 C)  TempSrc:    Oral  SpO2:   98% 95%  Weight:  Height:        Intake/Output Summary (Last 24 hours) at 06/28/16 1625 Last data filed at 06/28/16 1042  Gross per 24 hour  Intake                0 ml  Output              300 ml  Net             -300 ml   Filed Weights   06/27/16 1236 06/27/16 1525 06/27/16 2254  Weight: 127 kg (280 lb) (!) 158.8 kg (350 lb) (!) 158.8 kg (350 lb)    Examination:  General exam:  Adult male.  No acute distress. HEENT:  NCAT, MMM Respiratory system:  Clear to auscultation bilaterally Cardiovascular system: tachycardic, regular rhythm, normal S1/S2. No murmurs, rubs, gallops or clicks.  Warm extremities Gastrointestinal system: Normal active bowel sounds, soft, nondistended, nontender. MSK:  Normal tone and bulk, no lower extremity edema Neuro:  Grossly intact Skin:  Faint erythematous, lacy papular rash on arms, chest, and face, not raised.  No purpura or hives.  No excoriations.      Data Reviewed: I have personally reviewed following labs and imaging studies  CBC:  Recent Labs Lab 06/27/16 1250 06/28/16 0048 06/28/16 0613  WBC 16.1* 13.0* 13.1*  NEUTROABS 7.4 6.3 6.2  HGB 12.9* 11.7* 12.2*  HCT 38.4* 35.0* 38.1*  MCV 94.1 94.3 96.9  PLT 233 209 192   Basic Metabolic Panel:  Recent Labs Lab 06/27/16 1250 06/28/16 0048 06/28/16 0613  NA 132* 132* 135  K 3.5 3.4* 3.6  CL 99* 101 102  CO2 25 24 25   GLUCOSE 117* 112* 105*  BUN 9 7 7   CREATININE 1.31* 1.27* 1.23  CALCIUM 8.2* 7.7* 7.9*  MG  --  2.0  --   PHOS  --  2.9  --    GFR: Estimated Creatinine Clearance: 149.2 mL/min (by C-G formula based on SCr of 1.23 mg/dL). Liver Function Tests:  Recent Labs Lab 06/27/16 1250 06/28/16 0048 06/28/16 0613  AST 109* 97* 97*  ALT 72* 68* 72*  ALKPHOS 58 47 50  BILITOT 1.2 1.1 1.1  PROT 7.3 5.9* 5.9*  ALBUMIN 2.9* 2.3* 2.2*   No results for input(s): LIPASE, AMYLASE in the last 168 hours. No results for input(s): AMMONIA in the last 168 hours. Coagulation Profile: No results for input(s): INR, PROTIME in the last 168 hours. Cardiac Enzymes: No results for input(s): CKTOTAL, CKMB, CKMBINDEX, TROPONINI in the last 168 hours. BNP (last 3 results) No results for input(s): PROBNP in the last 8760 hours. HbA1C: No results for input(s): HGBA1C in the last 72 hours. CBG: No results for input(s): GLUCAP in the last 168 hours. Lipid Profile: No results for input(s): CHOL, HDL, LDLCALC, TRIG, CHOLHDL, LDLDIRECT in  the last 72 hours. Thyroid Function Tests: No results for input(s): TSH, T4TOTAL, FREET4, T3FREE, THYROIDAB in the last 72 hours. Anemia Panel: No results for input(s): VITAMINB12, FOLATE, FERRITIN, TIBC, IRON, RETICCTPCT in the last 72 hours. Urine analysis:    Component Value Date/Time   COLORURINE ORANGE (A) 06/27/2016 1320   APPEARANCEUR CLEAR 06/27/2016 1320   LABSPEC 1.025 06/27/2016 1320   PHURINE 6.0 06/27/2016 1320   GLUCOSEU NEGATIVE 06/27/2016 1320   HGBUR MODERATE (A) 06/27/2016 1320   BILIRUBINUR MODERATE (A) 06/27/2016 1320   KETONESUR 15 (A) 06/27/2016 1320   PROTEINUR >300 (A) 06/27/2016 1320   NITRITE POSITIVE (A) 06/27/2016 1320   LEUKOCYTESUR  SMALL (A) 06/27/2016 1320   Sepsis Labs: @LABRCNTIP (procalcitonin:4,lacticidven:4)  ) Recent Results (from the past 240 hour(s))  Urine culture     Status: Abnormal   Collection Time: 06/27/16  1:20 PM  Result Value Ref Range Status   Specimen Description URINE, RANDOM  Final   Special Requests NONE  Final   Culture (A)  Final    <10,000 COLONIES/mL INSIGNIFICANT GROWTH Performed at [facility]    Report Status 06/28/2016 FINAL  Final  Rapid strep screen     Status: None   Collection Time: 06/27/16  4:15 PM  Result Value Ref Range Status   Streptococcus, Group A Screen (Direct) NEGATIVE NEGATIVE Final    Comment: (NOTE) A Rapid Antigen test may result negative if the antigen level in the sample is below the detection level of this test. The FDA has not cleared this test as a stand-alone test therefore the rapid antigen negative result has reflexed to a Group A Strep culture.   Culture, group A strep     Status: None (Preliminary result)   Collection Time: 06/27/16  4:15 PM  Result Value Ref Range Status   Specimen Description THROAT  Final   Special Requests NONE Reflexed from [id number]  Final   Culture   Final    TOO YOUNG TO READ Performed at [facility]    Report Status PENDING  Incomplete        Radiology Studies: Dg Chest 2 View  Result Date: 06/27/2016 CLINICAL DATA:  Sepsis with cough EXAM: CHEST  2 VIEW COMPARISON:  None. FINDINGS: Heart and mediastinal contours are within normal limits. No focal opacities or effusions. No acute bony abnormality. IMPRESSION: No active cardiopulmonary disease. Electronically Signed   By: Charlett NoseKevin  Dover M.D.   On: 06/27/2016 14:57   Ct Angio Chest Pe W Or Wo Contrast  Result Date: 06/28/2016 CLINICAL DATA:  Pt states he has not been able to keep anything on his stomach for 2 weeks. He denies pain but his breathing is difficult EXAM: CT ANGIOGRAPHY CHEST WITH CONTRAST TECHNIQUE: Multidetector CT imaging of the chest was performed using the standard protocol during bolus administration of intravenous contrast. Multiplanar CT image reconstructions and MIPs were obtained to evaluate the vascular anatomy. CONTRAST:  Isovue 370 100ml COMPARISON:  CT abdomen 06/27/2016 FINDINGS: Vascular: Right arm IV contrast administration. The SVC is patent. Right atrium and right ventricle nondilated. Satisfactory opacification of pulmonary arteries noted, and there is no evidence of pulmonary emboli. Motion degrades some images through pulmonary artery branches. Patent bilateral pulmonary veins drain into the left atrium. Adequate contrast opacification of the thoracic aorta with no evidence of dissection, aneurysm, or stenosis. There is classic 3-vessel brachiocephalic arch anatomy without proximal stenosis. Mediastinum/Lymph Nodes: Subcentimeter prevascular, precarinal, and right paratracheal nodes. No definite hilar adenopathy. No pericardial effusion. Lungs/Pleura: Breathing motion degrades evaluation. No confluent infiltrate or nodule is evident. No pneumothorax. No pleural effusion. Upper abdomen: Small hiatal hernia. Stable borderline periportal adenopathy. No acute findings. Musculoskeletal: Mid thoracic partially calcified protrusions. Negative for fracture. Review of  the MIP images confirms the above findings. IMPRESSION: 1. Negative for acute PE or thoracic aortic dissection. 2. Small hiatal hernia. Electronically Signed   By: Corlis Leak  Hassell M.D.   On: 06/28/2016 13:54   Ct Abdomen Pelvis W Contrast  Result Date: 06/27/2016 CLINICAL DATA:  Vomiting for 2 weeks. EXAM: CT ABDOMEN AND PELVIS WITH CONTRAST TECHNIQUE: Multidetector CT imaging of the abdomen and pelvis was performed using the  standard protocol following bolus administration of intravenous contrast. CONTRAST:  ISOVUE-300 IOPAMIDOL (ISOVUE-300) INJECTION 61% COMPARISON:  None. FINDINGS: Lower chest: Lung bases are clear. Hepatobiliary: No focal hepatic lesion. No biliary duct dilatation. Gallbladder is normal. Common bile duct is normal. Pancreas: Pancreas is normal. No ductal dilatation. No pancreatic inflammation. Spleen: Normal spleen Adrenals/urinary tract: Adrenal glands and kidneys are normal. The ureters and bladder normal. Stomach/Bowel: Stomach, small bowel, appendix, and cecum are normal. The colon and rectosigmoid colon are normal. Vascular/Lymphatic: Abdominal aorta is normal caliber. There is no retroperitoneal or periportal lymphadenopathy. No pelvic lymphadenopathy. There are scattered periaortic and periportal lymph nodes not pathologic by size. LEFT external iliac lymph node measures 7 mm Daray Polgar axis (image 80, series 2). RIGHT inguinal lymph node measures 10 mm. Reproductive: Prostate normal Other: No free fluid. Musculoskeletal: No aggressive osseous lesion. IMPRESSION: 1. No acute abdominal pelvic findings. 2. Mildly prominent retroperitoneal and liac lymph nodes are not pathologic by size criteria and favored benign. Electronically Signed   By: Genevive Bi M.D.   On: 06/27/2016 19:07     Scheduled Meds: . benztropine  0.5 mg Oral BID  . cefTRIAXone (ROCEPHIN)  IV  1 g Intravenous Q12H  . enoxaparin (LOVENOX) injection  80 mg Subcutaneous Q24H  . FLUoxetine  10 mg Oral QHS  .  fluPHENAZine  5 mg Oral QHS  . iopamidol      . metronidazole  500 mg Intravenous Q6H  . sodium chloride flush  10-40 mL Intracatheter Q12H  . sodium chloride flush  3 mL Intravenous Q12H  . topiramate  50 mg Oral QHS  . trazodone  300 mg Oral QHS   Continuous Infusions: . 0.9 % NaCl with KCl 20 mEq / L 125 mL/hr at 06/28/16 0213     LOS: 0 days    Time spent: 30 min    Renae Fickle, MD [facility] Pager [phone]  If 7PM-7AM, please contact night-coverage [email or web address] Password [facility] 06/28/2016, 4:25 PM

## 2016-06-28 NOTE — Progress Notes (Addendum)
Peripherally Inserted Central Catheter/Midline Placement  The IV Nurse has discussed with the patient and/or persons authorized to consent for the patient, the purpose of this procedure and the potential benefits and risks involved with this procedure.  The benefits include less needle sticks, lab draws from the catheter, ability to perform an exchange if ordered by the physician and patient may be discharged home with the catheter.  Risks include, but not limited to, infection, bleeding, blood clot (thrombus formation), and puncture of an artery; nerve damage and irregular heat beat.  Alternatives to this procedure were also discussed.  Bard educational packet given to pt.  PICC/Midline Placement Documentation  PICC Double Lumen 06/28/16 PICC Right Cephalic 43 cm 0 cm (Active)  Indication for Insertion or Continuance of Line Poor Vasculature-patient has had multiple peripheral attempts or PIVs lasting less than 24 hours 06/28/2016  4:20 PM  Exposed Catheter (cm) 0 cm 06/28/2016  4:20 PM  Site Assessment Clean;Dry;Intact 06/28/2016  4:20 PM  Lumen #1 Status Flushed;Saline locked;Blood return noted 06/28/2016  4:20 PM  Lumen #2 Status Flushed;Saline locked;Blood return noted 06/28/2016  4:20 PM  Dressing Type Transparent 06/28/2016  4:20 PM  Dressing Status Clean;Dry;Intact 06/28/2016  4:20 PM  Dressing Change Due 07/05/16 06/28/2016  4:20 PM       Todd Burton, Allen Robert 06/28/2016, 4:22 PM

## 2016-06-28 NOTE — Progress Notes (Signed)
Notified Dr. Selena BattenKim that pt's temp is 101.3 and HR is 130-140. MD gave order for tylenol 650mg  po q6hprn. Will continue to monitor pt. Nelda MarseilleJenny Thacker, RN

## 2016-06-28 NOTE — H&P (Signed)
History and Physical    Todd BanningColby Burton ZOX:[id number] DOB: 06/16/1996 DOA: 06/27/2016  PCP: Everrett CoombeMatthews, [place], DO   [facility]  Chief Complaint: 2 weeks of nausea and vomiting.  HPI: Todd BanningColby Burton is a [age] y.o. male with medical history significant of who comes to the emergency department with a two-week history of nausea, vomiting usually once or twice a day and darkening of the urine. He denies fever, chills, abdominal pain, diarrhea, constipation, melena or hematochezia. He denies dysuria, frequency, hematuria or nocturia. He denies sore throat, cough, chest pain, dyspnea, palpitations, dizziness, diaphoresis, pitting edema of the lower extremities, PND or orthopnea.   ED Course: The patient was initially hypotensive, tachypneic, tachycardic in the emergency department. Blood pressure normalized after the patient received 3 L of normal saline, but the tachycardia, although better, has persisted.  Review of Systems: As per HPI otherwise 10 point review of systems negative.    Past Medical History:  Diagnosis Date  . Obesity   . Schizoaffective disorder (HCC)     History reviewed. No pertinent surgical history.   reports that he has been smoking.  He has never used smokeless tobacco. He reports that he drinks alcohol. He reports that he does not use drugs.  No Known Allergies  Family History  Problem Relation Age of Onset  . Pancreatic cancer Maternal Grandfather   . Pancreatic cancer Paternal Grandmother     Prior to Admission medications   Medication Sig Start Date End Date Taking? Authorizing Provider  benztropine (COGENTIN) 2 MG tablet Take 0.5 mg by mouth 2 (two) times daily.    Yes Historical Provider, MD  FLUoxetine (PROZAC) 10 MG capsule Take 10 mg by mouth daily.   Yes Historical Provider, MD  fluPHENAZine (PROLIXIN) 5 MG tablet Take 5 mg by mouth daily.   Yes Historical Provider, MD  topiramate (TOPAMAX) 25 MG tablet Take 50 mg by mouth at  bedtime.    Yes Historical Provider, MD  trazodone (DESYREL) 300 MG tablet Take 300 mg by mouth at bedtime.   Yes Historical Provider, MD  atomoxetine (STRATTERA) 10 MG capsule Take 10 mg by mouth daily.    Historical Provider, MD  fluorescein-benoxinate (FLURATE) ophthalmic solution 1 drop once.    Historical Provider, MD  risperiDONE (RISPERDAL) 0.25 MG tablet Take 0.25 mg by mouth at bedtime.    Historical Provider, MD    Physical Exam: Vitals:   06/27/16 2130 06/27/16 2148 06/27/16 2254 06/27/16 2300  BP: (!) 96/51 110/59 (!) 147/100   Pulse: (!) 130 (!) 131 (!) 133   Resp: 22 19 20  (!) 40  Temp:  101.1 F (38.4 C) 99.5 F (37.5 C)   TempSrc:   Oral   SpO2: 94% 95% 94%   Weight:   (!) 158.8 kg (350 lb)   Height:   6' (1.829 m)       Constitutional: NAD, calm, comfortable Vitals:   06/27/16 2130 06/27/16 2148 06/27/16 2254 06/27/16 2300  BP: (!) 96/51 110/59 (!) 147/100   Pulse: (!) 130 (!) 131 (!) 133   Resp: 22 19 20  (!) 40  Temp:  101.1 F (38.4 C) 99.5 F (37.5 C)   TempSrc:   Oral   SpO2: 94% 95% 94%   Weight:   (!) 158.8 kg (350 lb)   Height:   6' (1.829 m)    Eyes: PERRL, lids and conjunctivae normal ENMT: Mucous membranes are moist. Posterior pharynx clear of any exudate or lesions.  Neck: normal, supple, no masses, no thyromegaly Respiratory: clear to auscultation bilaterally, no wheezing, no crackles. Normal respiratory effort. No accessory muscle use.  Cardiovascular: Tachycardic at 120 bpm, no murmurs / rubs / gallops. No extremity edema. 2+ pedal pulses. No carotid bruits.  Abdomen: Obese, bowel sounds positive, soft, no tenderness, no masses palpated. No hepatosplenomegaly.  Musculoskeletal: no clubbing / cyanosis. No joint deformity upper and lower extremities. Good ROM, no contractures. Normal muscle tone.  Skin: no rashes, lesions, ulcers. No induration Neurologic: CN 2-12 grossly intact. Sensation intact, DTR normal. Strength 5/5 in all 4.    Psychiatric: Normal judgment and insight. Alert and oriented x 3. Normal mood.     Labs on Admission: I have personally reviewed following labs and imaging studies  CBC:  Recent Labs Lab 06/27/16 1250 06/28/16 0048  WBC 16.1* 13.0*  NEUTROABS 7.4 PENDING  HGB 12.9* 11.7*  HCT 38.4* 35.0*  MCV 94.1 94.3  PLT 233 209   Basic Metabolic Panel:  Recent Labs Lab 06/27/16 1250 06/28/16 0048  NA 132* 132*  K 3.5 3.4*  CL 99* 101  CO2 25 24  GLUCOSE 117* 112*  BUN 9 7  CREATININE 1.31* 1.27*  CALCIUM 8.2* 7.7*  MG  --  2.0  PHOS  --  2.9   GFR: Estimated Creatinine Clearance: 144.5 mL/min (by C-G formula based on SCr of 1.27 mg/dL). Liver Function Tests:  Recent Labs Lab 06/27/16 1250 06/28/16 0048  AST 109* 97*  ALT 72* 68*  ALKPHOS 58 47  BILITOT 1.2 1.1  PROT 7.3 5.9*  ALBUMIN 2.9* 2.3*   Urine analysis:    Component Value Date/Time   COLORURINE ORANGE (A) 06/27/2016 1320   APPEARANCEUR CLEAR 06/27/2016 1320   LABSPEC 1.025 06/27/2016 1320   PHURINE 6.0 06/27/2016 1320   GLUCOSEU NEGATIVE 06/27/2016 1320   HGBUR MODERATE (A) 06/27/2016 1320   BILIRUBINUR MODERATE (A) 06/27/2016 1320   KETONESUR 15 (A) 06/27/2016 1320   PROTEINUR >300 (A) 06/27/2016 1320   NITRITE POSITIVE (A) 06/27/2016 1320   LEUKOCYTESUR SMALL (A) 06/27/2016 1320    Recent Results (from the past 240 hour(s))  Rapid strep screen     Status: None   Collection Time: 06/27/16  4:15 PM  Result Value Ref Range Status   Streptococcus, Group A Screen (Direct) NEGATIVE NEGATIVE Final    Comment: (NOTE) A Rapid Antigen test may result negative if the antigen level in the sample is below the detection level of this test. The FDA has not cleared this test as a stand-alone test therefore the rapid antigen negative result has reflexed to a Group A Strep culture.      Radiological Exams on Admission: Dg Chest 2 View  Result Date: 06/27/2016 CLINICAL DATA:  Sepsis with cough EXAM:  CHEST  2 VIEW COMPARISON:  None. FINDINGS: Heart and mediastinal contours are within normal limits. No focal opacities or effusions. No acute bony abnormality. IMPRESSION: No active cardiopulmonary disease. Electronically Signed   By: Charlett Nose M.D.   On: 06/27/2016 14:57   Ct Abdomen Pelvis W Contrast  Result Date: 06/27/2016 CLINICAL DATA:  Vomiting for 2 weeks. EXAM: CT ABDOMEN AND PELVIS WITH CONTRAST TECHNIQUE: Multidetector CT imaging of the abdomen and pelvis was performed using the standard protocol following bolus administration of intravenous contrast. CONTRAST:  ISOVUE-300 IOPAMIDOL (ISOVUE-300) INJECTION 61% COMPARISON:  None. FINDINGS: Lower chest: Lung bases are clear. Hepatobiliary: No focal hepatic lesion. No biliary duct dilatation. Gallbladder is normal. Common bile  duct is normal. Pancreas: Pancreas is normal. No ductal dilatation. No pancreatic inflammation. Spleen: Normal spleen Adrenals/urinary tract: Adrenal glands and kidneys are normal. The ureters and bladder normal. Stomach/Bowel: Stomach, small bowel, appendix, and cecum are normal. The colon and rectosigmoid colon are normal. Vascular/Lymphatic: Abdominal aorta is normal caliber. There is no retroperitoneal or periportal lymphadenopathy. No pelvic lymphadenopathy. There are scattered periaortic and periportal lymph nodes not pathologic by size. LEFT external iliac lymph node measures 7 mm short axis (image 80, series 2). RIGHT inguinal lymph node measures 10 mm. Reproductive: Prostate normal Other: No free fluid. Musculoskeletal: No aggressive osseous lesion. IMPRESSION: 1. No acute abdominal pelvic findings. 2. Mildly prominent retroperitoneal and liac lymph nodes are not pathologic by size criteria and favored benign. Electronically Signed   By: Genevive Bi M.D.   On: 06/27/2016 19:07    EKG: Independently reviewed. Vent. rate 135 BPM PR interval * ms QRS duration 77 ms QT/QTc 308/462 ms P-R-T axes 55 79  -17 Sinus tachycardia Borderline Q waves in inferior leads Borderline T abnormalities, diffuse leads  Assessment/Plan Principal problem :   SIRS (systemic inflammatory response syndrome) (HCC) Admit to telemetry/observation. Continue IV fluids. Continue IV ceftriaxone 1 g every 12 hours. Metronidazole 1 g every 8 hours. Follow-up blood cultures and sensitivity.  Active Problems:     UTI (urinary tract infection) Mildly suggestive urinalysis. Continue Rocephin.    Tachycardia/ episodic Tachypnea Continue IV fluids and optimize electrolytes. Check chest CT angio to rule out PE. May need to be delayed due to the patient receiving contrast earlier for CT abdomen/pelvis. Check echocardiogram.    Hypokalemia Replacing. Follow-up potassium level. Check magnesium level.    Schizoaffective disorder (HCC) Continue Cogentin twice a day. Continue Prozac 10 mg a day. Continue Prolixin 5 mg by mouth daily. Continue Topamax 50 mg by mouth at bedtime. Continue trazodone 300 mg by mouth at bedtime. Continue Strattera 10 mg by mouth daily. Continue Risperdal 0.25 mg at bedtime.     DVT prophylaxis: Lovenox SQ. Code Status: Full code. Family Communication:  Disposition Plan: Admit for IV antibiotic therapy and further evaluation. Consults called:  Admission status: Observation/telemetry.   Bobette Mo MD [facility] Pager [phone].  If 7PM-7AM, please contact night-coverage [email or web address] Password [email or web address]  06/28/2016, 1:40 AM

## 2016-06-28 NOTE — Progress Notes (Signed)
Patient has home CPAP unit with him. Unit has been checked and patient states he places himself on and off when ready. RT informed patient if he has any trouble to have RN contact RT.

## 2016-06-29 ENCOUNTER — Inpatient Hospital Stay (HOSPITAL_COMMUNITY): Payer: Medicaid Other

## 2016-06-29 DIAGNOSIS — L304 Erythema intertrigo: Secondary | ICD-10-CM

## 2016-06-29 DIAGNOSIS — R945 Abnormal results of liver function studies: Secondary | ICD-10-CM

## 2016-06-29 DIAGNOSIS — D72829 Elevated white blood cell count, unspecified: Secondary | ICD-10-CM

## 2016-06-29 DIAGNOSIS — D72820 Lymphocytosis (symptomatic): Secondary | ICD-10-CM

## 2016-06-29 DIAGNOSIS — R509 Fever, unspecified: Secondary | ICD-10-CM

## 2016-06-29 LAB — CULTURE, GROUP A STREP (THRC)

## 2016-06-29 LAB — BASIC METABOLIC PANEL
ANION GAP: 6 (ref 5–15)
BUN: 9 mg/dL (ref 6–20)
CHLORIDE: 102 mmol/L (ref 101–111)
CO2: 24 mmol/L (ref 22–32)
Calcium: 7.9 mg/dL — ABNORMAL LOW (ref 8.9–10.3)
Creatinine, Ser: 1.32 mg/dL — ABNORMAL HIGH (ref 0.61–1.24)
Glucose, Bld: 101 mg/dL — ABNORMAL HIGH (ref 65–99)
POTASSIUM: 3.7 mmol/L (ref 3.5–5.1)
SODIUM: 132 mmol/L — AB (ref 135–145)

## 2016-06-29 LAB — HEPATITIS PANEL, ACUTE
HCV Ab: <0.1 s/co ratio (ref 0.0–0.9)
HEP B S AG: NEGATIVE
Hep A IgM: NEGATIVE
Hep B C IgM: NEGATIVE

## 2016-06-29 LAB — CMV ANTIBODY, IGG (EIA): CMV AB - IGG: 4 U/mL — AB (ref 0.00–0.59)

## 2016-06-29 LAB — CBC
HEMATOCRIT: 35.8 % — AB (ref 39.0–52.0)
HEMOGLOBIN: 11.4 g/dL — AB (ref 13.0–17.0)
MCH: 30.7 pg (ref 26.0–34.0)
MCHC: 31.8 g/dL (ref 30.0–36.0)
MCV: 96.5 fL (ref 78.0–100.0)
Platelets: 214 10*3/uL (ref 150–400)
RBC: 3.71 MIL/uL — AB (ref 4.22–5.81)
RDW: 12.9 % (ref 11.5–15.5)
WBC: 17.2 10*3/uL — AB (ref 4.0–10.5)

## 2016-06-29 LAB — DIFFERENTIAL
BASOS ABS: 0.5 10*3/uL — AB (ref 0.0–0.1)
Basophils Relative: 3 %
Eosinophils Absolute: 0.2 10*3/uL (ref 0.0–0.7)
Eosinophils Relative: 1 %
LYMPHS ABS: 7.8 10*3/uL — AB (ref 0.7–4.0)
Lymphocytes Relative: 42 %
MONOS PCT: 11 %
Monocytes Absolute: 1.9 10*3/uL — ABNORMAL HIGH (ref 0.1–1.0)
NEUTROS PCT: 43 %
Neutro Abs: 7.6 10*3/uL (ref 1.7–7.7)

## 2016-06-29 LAB — CK: Total CK: 875 U/L — ABNORMAL HIGH (ref 49–397)

## 2016-06-29 LAB — ECHOCARDIOGRAM COMPLETE
FS: 21 % — AB (ref 28–44)
HEIGHTINCHES: 72 in
IVS/LV PW RATIO, ED: 1.02
LA ID, A-P, ES: 31 mm
LA diam end sys: 31 mm
LA diam index: 1.06 cm/m2
LA vol A4C: 46.1 ml
LA vol index: 14.3 mL/m2
LA vol: 41.8 mL
LDCA: 3.46 cm2
LVOT diameter: 21 mm
Lateral S' vel: 16.5 cm/s
PW: 8.3 mm — AB (ref 0.6–1.1)
RV TAPSE: 23.8 mm
WEIGHTICAEL: 5600 [oz_av]

## 2016-06-29 LAB — HIV-1 RNA QUANT-NO REFLEX-BLD
HIV 1 RNA Quant: <20 copies/mL
LOG10 HIV-1 RNA: UNDETERMINED log10copy/mL

## 2016-06-29 LAB — LIPASE, BLOOD: Lipase: 18 U/L (ref 11–51)

## 2016-06-29 LAB — CMV IGM: CMV IGM: 118 [AU]/ml — AB (ref 0.0–29.9)

## 2016-06-29 LAB — LACTIC ACID, PLASMA: LACTIC ACID, VENOUS: 1.5 mmol/L (ref 0.5–1.9)

## 2016-06-29 LAB — HIV ANTIBODY (ROUTINE TESTING W REFLEX): HIV Screen 4th Generation wRfx: NONREACTIVE

## 2016-06-29 LAB — AMYLASE: Amylase: 45 U/L (ref 28–100)

## 2016-06-29 MED ORDER — SODIUM CHLORIDE 0.9 % IV SOLN
2000.0000 mg | Freq: Once | INTRAVENOUS | Status: AC
  Administered 2016-06-29: 2000 mg via INTRAVENOUS
  Filled 2016-06-29: qty 2000

## 2016-06-29 MED ORDER — FLUPHENAZINE HCL 5 MG PO TABS
15.0000 mg | ORAL_TABLET | Freq: Every day | ORAL | Status: DC
  Administered 2016-06-29 – 2016-07-01 (×3): 15 mg via ORAL
  Filled 2016-06-29 (×3): qty 1

## 2016-06-29 MED ORDER — FOLIC ACID 1 MG PO TABS
1.0000 mg | ORAL_TABLET | Freq: Every day | ORAL | Status: DC
  Administered 2016-06-29 – 2016-07-02 (×4): 1 mg via ORAL
  Filled 2016-06-29 (×4): qty 1

## 2016-06-29 MED ORDER — FLUOXETINE HCL 20 MG PO CAPS
40.0000 mg | ORAL_CAPSULE | Freq: Every day | ORAL | Status: DC
  Administered 2016-06-29 – 2016-07-01 (×3): 40 mg via ORAL
  Filled 2016-06-29 (×3): qty 2

## 2016-06-29 MED ORDER — PIPERACILLIN-TAZOBACTAM 3.375 G IVPB
3.3750 g | Freq: Three times a day (TID) | INTRAVENOUS | Status: DC
  Administered 2016-06-29 – 2016-06-30 (×4): 3.375 g via INTRAVENOUS
  Filled 2016-06-29 (×6): qty 50

## 2016-06-29 MED ORDER — SODIUM CHLORIDE 0.9 % IV SOLN
1500.0000 mg | Freq: Three times a day (TID) | INTRAVENOUS | Status: DC
  Filled 2016-06-29: qty 1500

## 2016-06-29 MED ORDER — CLOTRIMAZOLE 1 % EX CREA
TOPICAL_CREAM | Freq: Two times a day (BID) | CUTANEOUS | Status: DC
  Administered 2016-06-29 (×2): via TOPICAL
  Administered 2016-06-30 – 2016-07-01 (×3): 1 via TOPICAL
  Administered 2016-07-02: 09:00:00 via TOPICAL
  Filled 2016-06-29: qty 15

## 2016-06-29 MED ORDER — BENZTROPINE MESYLATE 1 MG PO TABS
0.5000 mg | ORAL_TABLET | Freq: Every day | ORAL | Status: DC
  Administered 2016-06-29 – 2016-07-01 (×3): 0.5 mg via ORAL
  Filled 2016-06-29 (×3): qty 1

## 2016-06-29 MED ORDER — ALUM & MAG HYDROXIDE-SIMETH 200-200-20 MG/5ML PO SUSP: 30.0000 mL | Freq: Four times a day (QID) | ORAL | Status: DC | PRN

## 2016-06-29 NOTE — Progress Notes (Addendum)
PROGRESS NOTE  Todd Burton  ZOX:[id number] DOB: 1995-12-05 DOA: 06/27/2016 PCP: Everrett Coombe, DO  Brief Narrative:   Todd Burton is a 20 y.o. male with schizoaffective disorder who presented to the emergency department with a two-week history of nausea, vomiting, particularly after meals.  He has some cough which would sometimes precede his vomiting.  He denies fever, chills, abdominal pain, diarrhea, constipation, melena or hematochezia, dysuria, frequency, hematuria, sore throat, cough, chest pain, dyspnea, palpitations, dizziness, diaphoresis, pitting edema of the lower extremities, PND or orthopnea. In the ER, he was hypotensive, tachypneic, tachycardic. Blood pressure normalized after the patient received 3 L of normal saline, but the tachycardia, although better, has persisted.  He developed a fever to 101.58F shortly after admission.  Labs were concerning for leukocytosis with lymphocytosis with atypical lymphocytes.  Duration of his symptoms is unusual for typical viral infection.  He has also been persistently tachycardic out of proportion to his fever and the amount of IVF he has received.  Monospot negative.    Assessment & Plan:   Principal Problem:   SIRS (systemic inflammatory response syndrome) (HCC) Active Problems:   UTI (urinary tract infection)   Tachycardia   Hypokalemia   Schizoaffective disorder (HCC)   Tachypnea  Sepsis physiology but unclear source.  Two weeks of nausea prior to admission followed by fever and now diarrhea.  WBC increased dramatically this morning.  Also consider drug reaction:  No muscle rigidity or clonus.  Medications stable for many months.  No family history of autoimmune disease  -  Rash appears to be resolving -  Check GI pathogen panel -  Urine culture:  Insignificant growth -  Rapid strep negative -  Monospot negative -  HIV quant RNA and HIV ab/ag -  HIV screening test negative -  CMV IgM, IgG -  RPR -  Acute hepatitis panel  negative -  RMSF panel -  TSH wnl and free t4 only marginally elevated, not the explanation for his symptoms -  LDH 594 but may be due to mild hemolysis from fevers and not necessarily from underlying malignancy.  -  Continue doxycycline -  Repeat blood cultures -  Admission blood cultures NGTD -  Case discussed with psychiatry who stated that there is a risk for vomiting and diarrhea with some of his medications, but usually right after they are started.  Neuroleptic malignant syndrome and serotonin syndrome can cause vital sign instability, fevers, etc, but are associated with clonus and rigidity, neither of which he has at this time.  Will check a CK.  If any concern for NMS or SS, it is okay to stop his medications "cold [place]" but given his presentation, low suspicion at this time.  Sinus tachycardia, out of proportion to his degree of dehydration and fever.  ddx includes myocarditis and hyperthyroidism -  Urine drug screen negative -  CT angio negative for PE -  TSH and free T4 near normal -  Troponin negative    Hypokalemia, resolved with supplementation    Schizoaffective disorder (HCC) Continue Cogentin nightly  Continue Prozac 40 mg a day. Continue fluphenazine 15 mg by mouth daily. Continue Topamax 50 mg by mouth at bedtime. Continue trazodone 300 mg by mouth at bedtime. Continue Strattera 10 mg by mouth daily. Continue Risperdal 0.25 mg at bedtime.  OSA, encouraged him to the use the bipap machine which is at bedside  Morbid obesity -  Had some weight loss over last two weeks because not able  to eat  Normocytic anemia, possible mild hemolysis given elevated LDH.  No schistocytes seen on smear -  Iron studies, B12 wnl -  Folate mildly low > start folate supplementation -  Repeat hgb in AM   DVT prophylaxis:  lovenox Code Status:  full Family Communication:  Patient and mother at bedside Disposition Plan:  Improvement in tachycardia.  Would like to see fevers  trending down   Consultants:   none  Procedures:  none  Antimicrobials:   Ceftriaxone and flagyl8/25 > 8/26  Doxycycline 8/26  Vancomycin and zosyn 8/27 >    Subjective:  Having fevers and does not feel like his heart is racing or his breathing is fast.  Denies chest pains.  Was able to eat a little yesterday for the first time.  Had three episodes of watery stools since yesterday.    Objective: Vitals:   06/28/16 2150 06/28/16 2243 06/29/16 0126 06/29/16 0514  BP: (!) 119/53   (!) 129/54  Pulse: (!) 126  (!) 118 (!) 120  Resp: (!) 30   (!) 35  Temp: (!) 101.3 F (38.5 C) 100 F (37.8 C)  98.4 F (36.9 C)  TempSrc:  Oral  Oral  SpO2: 97%   92%  Weight:      Height:        Intake/Output Summary (Last 24 hours) at 06/29/16 1215 Last data filed at 06/29/16 1610  Gross per 24 hour  Intake          3115.83 ml  Output              750 ml  Net          2365.83 ml   Filed Weights   06/27/16 1236 06/27/16 1525 06/27/16 2254  Weight: 127 kg (280 lb) (!) 158.8 kg (350 lb) (!) 158.8 kg (350 lb)    Examination:  General exam:  Adult male, tachypneic  HEENT:  NCAT, MMM Respiratory system: Clear to auscultation bilaterally Cardiovascular system: tachycardic, regular rhythm, normal S1/S2. No murmurs, rubs, gallops or clicks.  Warm extremities Gastrointestinal system: Normal active bowel sounds, soft, nondistended, nontender. MSK:  Normal tone and bulk, no lower extremity edema Neuro:  Grossly intact Skin:  Faint erythematous, lacy rash on arms, chest, and face, not raised is not visible today.  No purpura or hives.  No excoriations.      Data Reviewed: I have personally reviewed following labs and imaging studies  CBC:  Recent Labs Lab 06/27/16 1250 06/28/16 0048 06/28/16 0613 06/29/16 0421  WBC 16.1* 13.0* 13.1* 17.2*  NEUTROABS 7.4 6.3 6.2  --   HGB 12.9* 11.7* 12.2* 11.4*  HCT 38.4* 35.0* 38.1* 35.8*  MCV 94.1 94.3 96.9 96.5  PLT 233 209 192 214    Basic Metabolic Panel:  Recent Labs Lab 06/27/16 1250 06/28/16 0048 06/28/16 0613 06/29/16 0421  NA 132* 132* 135 132*  K 3.5 3.4* 3.6 3.7  CL 99* 101 102 102  CO2 25 24 25 24   GLUCOSE 117* 112* 105* 101*  BUN 9 7 7 9   CREATININE 1.31* 1.27* 1.23 1.32*  CALCIUM 8.2* 7.7* 7.9* 7.9*  MG  --  2.0  --   --   PHOS  --  2.9  --   --    GFR: Estimated Creatinine Clearance: 139 mL/min (by C-G formula based on SCr of 1.32 mg/dL). Liver Function Tests:  Recent Labs Lab 06/27/16 1250 06/28/16 0048 06/28/16 0613  AST 109* 97* 97*  ALT 72*  68* 72*  ALKPHOS 58 47 50  BILITOT 1.2 1.1 1.1  PROT 7.3 5.9* 5.9*  ALBUMIN 2.9* 2.3* 2.2*   No results for input(s): LIPASE, AMYLASE in the last 168 hours. No results for input(s): AMMONIA in the last 168 hours. Coagulation Profile: No results for input(s): INR, PROTIME in the last 168 hours. Cardiac Enzymes:  Recent Labs Lab 06/28/16 1805  TROPONINI <0.03   BNP (last 3 results) No results for input(s): PROBNP in the last 8760 hours. HbA1C: No results for input(s): HGBA1C in the last 72 hours. CBG: No results for input(s): GLUCAP in the last 168 hours. Lipid Profile: No results for input(s): CHOL, HDL, LDLCALC, TRIG, CHOLHDL, LDLDIRECT in the last 72 hours. Thyroid Function Tests:  Recent Labs  06/28/16 1805  TSH 2.131  FREET4 1.15*   Anemia Panel:  Recent Labs  06/28/16 1805  VITAMINB12 496  FOLATE 5.2*  FERRITIN 542*  TIBC 231*  IRON 37*   Urine analysis:    Component Value Date/Time   COLORURINE ORANGE (A) 06/27/2016 1320   APPEARANCEUR CLEAR 06/27/2016 1320   LABSPEC 1.025 06/27/2016 1320   PHURINE 6.0 06/27/2016 1320   GLUCOSEU NEGATIVE 06/27/2016 1320   HGBUR MODERATE (A) 06/27/2016 1320   BILIRUBINUR MODERATE (A) 06/27/2016 1320   KETONESUR 15 (A) 06/27/2016 1320   PROTEINUR >300 (A) 06/27/2016 1320   NITRITE POSITIVE (A) 06/27/2016 1320   LEUKOCYTESUR SMALL (A) 06/27/2016 1320   Sepsis  Labs: @LABRCNTIP (procalcitonin:4,lacticidven:4)  ) Recent Results (from the past 240 hour(s))  Urine culture     Status: Abnormal   Collection Time: 06/27/16  1:20 PM  Result Value Ref Range Status   Specimen Description URINE, RANDOM  Final   Special Requests NONE  Final   Culture (A)  Final    <10,000 COLONIES/mL INSIGNIFICANT GROWTH Performed at [facility]    Report Status 06/28/2016 FINAL  Final  Blood Culture (routine x 2)     Status: None (Preliminary result)   Collection Time: 06/27/16  3:00 PM  Result Value Ref Range Status   Specimen Description BLOOD LEFT ARM  Final   Special Requests BOTTLES DRAWN AEROBIC AND ANAEROBIC 5CC EACH  Final   Culture   Final    NO GROWTH < 24 HOURS Performed at [facility]    Report Status PENDING  Incomplete  Blood Culture (routine x 2)     Status: None (Preliminary result)   Collection Time: 06/27/16  3:20 PM  Result Value Ref Range Status   Specimen Description BLOOD LEFT HAND  Final   Special Requests BOTTLES DRAWN AEROBIC AND ANAEROBIC 5CC EACH  Final   Culture   Final    NO GROWTH < 24 HOURS Performed at [facility]    Report Status PENDING  Incomplete  Rapid strep screen     Status: None   Collection Time: 06/27/16  4:15 PM  Result Value Ref Range Status   Streptococcus, Group A Screen (Direct) NEGATIVE NEGATIVE Final    Comment: (NOTE) A Rapid Antigen test may result negative if the antigen level in the sample is below the detection level of this test. The FDA has not cleared this test as a stand-alone test therefore the rapid antigen negative result has reflexed to a Group A Strep culture.   Culture, group A strep     Status: None (Preliminary result)   Collection Time: 06/27/16  4:15 PM  Result Value Ref Range Status   Specimen Description THROAT  Final   Special Requests NONE Reflexed from [id number]  Final   Culture   Final    TOO YOUNG TO READ Performed at [facility]    Report  Status PENDING  Incomplete      Radiology Studies: Dg Chest 2 View  Result Date: 06/27/2016 CLINICAL DATA:  Sepsis with cough EXAM: CHEST  2 VIEW COMPARISON:  None. FINDINGS: Heart and mediastinal contours are within normal limits. No focal opacities or effusions. No acute bony abnormality. IMPRESSION: No active cardiopulmonary disease. Electronically Signed   By: Charlett Nose M.D.   On: 06/27/2016 14:57   Ct Angio Chest Pe W Or Wo Contrast  Result Date: 06/28/2016 CLINICAL DATA:  Pt states he has not been able to keep anything on his stomach for 2 weeks. He denies pain but his breathing is difficult EXAM: CT ANGIOGRAPHY CHEST WITH CONTRAST TECHNIQUE: Multidetector CT imaging of the chest was performed using the standard protocol during bolus administration of intravenous contrast. Multiplanar CT image reconstructions and MIPs were obtained to evaluate the vascular anatomy. CONTRAST:  Isovue 370 COMPARISON:  CT abdomen 06/27/2016 FINDINGS: Vascular: Right arm IV contrast administration. The SVC is patent. Right atrium and right ventricle nondilated. Satisfactory opacification of pulmonary arteries noted, and there is no evidence of pulmonary emboli. Motion degrades some images through pulmonary artery branches. Patent bilateral pulmonary veins drain into the left atrium. Adequate contrast opacification of the thoracic aorta with no evidence of dissection, aneurysm, or stenosis. There is classic 3-vessel brachiocephalic arch anatomy without proximal stenosis. Mediastinum/Lymph Nodes: Subcentimeter prevascular, precarinal, and right paratracheal nodes. No definite hilar adenopathy. No pericardial effusion. Lungs/Pleura: Breathing motion degrades evaluation. No confluent infiltrate or nodule is evident. No pneumothorax. No pleural effusion. Upper abdomen: Small hiatal hernia. Stable borderline periportal adenopathy. No acute findings. Musculoskeletal: Mid thoracic partially calcified protrusions.  Negative for fracture. Review of the MIP images confirms the above findings. IMPRESSION: 1. Negative for acute PE or thoracic aortic dissection. 2. Small hiatal hernia. Electronically Signed   By: Corlis Leak M.D.   On: 06/28/2016 13:54   Ct Abdomen Pelvis W Contrast  Result Date: 06/27/2016 CLINICAL DATA:  Vomiting for 2 weeks. EXAM: CT ABDOMEN AND PELVIS WITH CONTRAST TECHNIQUE: Multidetector CT imaging of the abdomen and pelvis was performed using the standard protocol following bolus administration of intravenous contrast. CONTRAST:  ISOVUE-300 IOPAMIDOL (ISOVUE-300) INJECTION 61% COMPARISON:  None. FINDINGS: Lower chest: Lung bases are clear. Hepatobiliary: No focal hepatic lesion. No biliary duct dilatation. Gallbladder is normal. Common bile duct is normal. Pancreas: Pancreas is normal. No ductal dilatation. No pancreatic inflammation. Spleen: Normal spleen Adrenals/urinary tract: Adrenal glands and kidneys are normal. The ureters and bladder normal. Stomach/Bowel: Stomach, small bowel, appendix, and cecum are normal. The colon and rectosigmoid colon are normal. Vascular/Lymphatic: Abdominal aorta is normal caliber. There is no retroperitoneal or periportal lymphadenopathy. No pelvic lymphadenopathy. There are scattered periaortic and periportal lymph nodes not pathologic by size. LEFT external iliac lymph node measures 7 mm Kierria Feigenbaum axis (image 80, series 2). RIGHT inguinal lymph node measures 10 mm. Reproductive: Prostate normal Other: No free fluid. Musculoskeletal: No aggressive osseous lesion. IMPRESSION: 1. No acute abdominal pelvic findings. 2. Mildly prominent retroperitoneal and liac lymph nodes are not pathologic by size criteria and favored benign. Electronically Signed   By: Genevive Bi M.D.   On: 06/27/2016 19:07     Scheduled Meds: . benztropine  0.5 mg Oral QHS  . doxycycline  100 mg Oral  Q12H  . enoxaparin (LOVENOX) injection  80 mg Subcutaneous Q24H  . FLUoxetine  40 mg  Oral QHS  . fluPHENAZine  15 mg Oral QHS  . folic acid  1 mg Oral Daily  . piperacillin-tazobactam (ZOSYN)  IV  3.375 g Intravenous Q8H  . sodium chloride flush  10-40 mL Intracatheter Q12H  . sodium chloride flush  3 mL Intravenous Q12H  . topiramate  50 mg Oral QHS  . trazodone  300 mg Oral QHS  . vancomycin  1,500 mg Intravenous Q8H  . vancomycin  2,000 mg Intravenous Once   Continuous Infusions: . 0.9 % NaCl with KCl 20 mEq / L 125 mL/hr at 06/29/16 0351     LOS: 1 day    Time spent: 30 min    Renae FickleSHORT, Judene Logue, MD [facility] Pager [phone]  If 7PM-7AM, please contact night-coverage [email or web address] Password [email or web address] 06/29/2016, 12:15 PM

## 2016-06-29 NOTE — Consult Note (Signed)
Referral MD  Reason for Referral: Fever of Unknown origin. Leukocytosis,mild adenopathy  Chief Complaint  Patient presents with  . Emesis  : The patient really cannot give any history.  HPI: Todd Burton is a very nice 20 year old African-American male. He has a history of psychiatric illness. He is on some psychiatric meds.  He apparently began to have some nausea and vomiting and cannot eat for the past several days. He had a temperature. He was not having any kind of diarrhea.  He ultimately was admitted. When he was admitted, he does not have a mild leukocytosis. His white count 16,000 with 25th. Hemoglobin was 12.9 and platelet count 233,000. He had a mild leukocytosis with some atypical lymphocytes.  Today, his white cell count 17,000. There is no white cell differential.  He's had scans done. CT of the abdomen and pelvis was relatively unremarkable. Nonpathologic lymph nodes. He had no splenomegaly.  He had a CT angiogram of the chest. There is no pulmonary embolism.  He has sleep apnea. He does not use the CPAP machine.  He was found have a markedly elevated CMV IgM titer. He was checked for hepatitis this is all negative. An HIV titer is pending.  His chemistry panel shows mildly elevated liver function studies. His potassium is 3.4. Creatinine 1.27. Albumin 2.3.  He did have some iron studies done. His iron saturation is only 16%.  He has known markedly elevated LDH of 574.  His vitamin B-12 level is 496.  There is no history of sickle cell disease.  He is a very large man. There's been no rashes. He's had no obvious weight loss or weight gain. He's had no travel. He's had no bleeding or bruising.  He never has had a blood transfusion. He does not do any type of recreational IV drugs outside of marijuana.  I saw him this afternoon, he had the CPAP machine on. His sister was there to provide some information.   Past Medical History:  Diagnosis Date  . Obesity   .  Schizoaffective disorder (HCC)   :  History reviewed. No pertinent surgical history.:   Current Facility-Administered Medications:  .  0.9 % NaCl with KCl 20 mEq/ L  infusion, , Intravenous, Continuous, Bobette Mo, MD, Last Rate: 125 mL/hr at 06/29/16 0351 .  acetaminophen (TYLENOL) tablet 650 mg, 650 mg, Oral, Q6H PRN, Pearson Grippe, MD, 650 mg at 06/28/16 2157 .  benztropine (COGENTIN) tablet 0.5 mg, 0.5 mg, Oral, QHS, Renae Fickle, MD .  clotrimazole (LOTRIMIN) 1 % cream, , Topical, BID, Ginnie Smart, MD .  enoxaparin (LOVENOX) injection 80 mg, 80 mg, Subcutaneous, Q24H, Bobette Mo, MD, 80 mg at 06/29/16 0831 .  FLUoxetine (PROZAC) capsule 40 mg, 40 mg, Oral, QHS, Renae Fickle, MD .  fluPHENAZine (PROLIXIN) tablet 15 mg, 15 mg, Oral, QHS, Renae Fickle, MD .  folic acid (FOLVITE) tablet 1 mg, 1 mg, Oral, Daily, Renae Fickle, MD, 1 mg at 06/29/16 1143 .  ondansetron (ZOFRAN) injection 4 mg, 4 mg, Intravenous, Q6H PRN, Bobette Mo, MD, 4 mg at 06/29/16 1610 .  piperacillin-tazobactam (ZOSYN) IVPB 3.375 g, 3.375 g, Intravenous, Q8H, Mosetta Anis, RPH, 3.375 g at 06/29/16 1129 .  sodium chloride flush (NS) 0.9 % injection 10-40 mL, 10-40 mL, Intracatheter, Q12H, Renae Fickle, MD .  sodium chloride flush (NS) 0.9 % injection 10-40 mL, 10-40 mL, Intracatheter, PRN, Renae Fickle, MD, 10 mL at 06/29/16 0421 .  sodium chloride flush (NS)  0.9 % injection 3 mL, 3 mL, Intravenous, Q12H, Bobette Moavid Manuel Ortiz, MD, 3 mL at 06/28/16 1000 .  topiramate (TOPAMAX) tablet 50 mg, 50 mg, Oral, QHS, Bobette Moavid Manuel Ortiz, MD, 50 mg at 06/28/16 2156 .  traZODone (DESYREL) tablet 300 mg, 300 mg, Oral, QHS, Bobette Moavid Manuel Ortiz, MD, 300 mg at 06/28/16 2157:  . benztropine  0.5 mg Oral QHS  . clotrimazole   Topical BID  . enoxaparin (LOVENOX) injection  80 mg Subcutaneous Q24H  . FLUoxetine  40 mg Oral QHS  . fluPHENAZine  15 mg Oral QHS  . folic acid  1 mg Oral Daily  .  piperacillin-tazobactam (ZOSYN)  IV  3.375 g Intravenous Q8H  . sodium chloride flush  10-40 mL Intracatheter Q12H  . sodium chloride flush  3 mL Intravenous Q12H  . topiramate  50 mg Oral QHS  . trazodone  300 mg Oral QHS  :  No Known Allergies:  Family History  Problem Relation Age of Onset  . Pancreatic cancer Maternal Grandfather   . Pancreatic cancer Paternal Grandmother   :  Social History   Social History  . Marital status: Single    Spouse name: N/A  . Number of children: N/A  . Years of education: N/A   Occupational History  . Not on file.   Social History Main Topics  . Smoking status: Current Every Day Smoker  . Smokeless tobacco: Never Used  . Alcohol use Yes     Comment: weekend  . Drug use: No  . Sexual activity: Not on file   Other Topics Concern  . Not on file   Social History Narrative  . No narrative on file  :  Pertinent items are noted in HPI.  Exam: Patient Vitals for the past 24 hrs:  BP Temp Temp src Pulse Resp SpO2  06/29/16 1251 132/73 98.9 F (37.2 C) - (!) 111 18 93 %  06/29/16 0514 (!) 129/54 98.4 F (36.9 C) Oral (!) 120 (!) 35 92 %  06/29/16 0126 - - - (!) 118 - -  06/28/16 2243 - 100 F (37.8 C) Oral - - -  06/28/16 2150 (!) 119/53 (!) 101.3 F (38.5 C) - (!) 126 (!) 30 97 %  06/28/16 1507 (!) 124/57 99.3 F (37.4 C) Oral (!) 126 (!) 24 95 %    As above    Recent Labs  06/28/16 0613 06/29/16 0421  WBC 13.1* 17.2*  HGB 12.2* 11.4*  HCT 38.1* 35.8*  PLT 192 214    Recent Labs  06/28/16 0613 06/29/16 0421  NA 135 132*  K 3.6 3.7  CL 102 102  CO2 25 24  GLUCOSE 105* 101*  BUN 7 9  CREATININE 1.23 1.32*  CALCIUM 7.9* 7.9*    Blood smear review:  Normochromic and normocytic population of red blood cells. There are no nucleated red blood cells. There are no teardrop cells. There are no target cells. He has no rouleau formation. White cells are slightly increased in number. He does have some large atypical  lymphocytes. I do not see any lymphoblasts. There is no hypersegmented polys. There is no immature myeloid cells. Platelets are adequate number size. Has several large platelets.   Pathology: None     Assessment and Plan:  Mr. Todd Burton is a 20 year old African-American male. He has mild lymphocytosis. Has an elevated CMV titer.  His blood smear certainly looks reactive. The lymphocytes appear to be more viral driven. They do not look malignant.  However, I will send off his blood for flow cytometry to check out his CD4 count.  I really have to wonder if he does not have HIV. This would be a common thread diagnosis.  I don't think needs a bone marrow test. I I do think that the lymph nodes on scans are reactive.  I will follow-up with him as indicated.  I appreciate the opportunity of seeing him.   Christin Bach, MD  Todd Burton 1:5-7

## 2016-06-29 NOTE — Progress Notes (Signed)
Encouraged pt multiple times to wear personal BIPAP tonight. Pt refusing to wear BIPAP, states he doesn't want to wear it. Will continue to monitor pt. Nelda MarseilleJenny Thacker, RN

## 2016-06-29 NOTE — Consult Note (Signed)
[place] [facility] Infectious Disease  Date of Admission:  06/27/2016  Date of Consult:  06/29/2016  Reason for Consult: FUO Referring Physician: Short  Impression/Recommendation FUO  Consider possibilty of mono (CMV may cause this). 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  Check HIV- acute HIV can absolutely cause this syndrome. His Ab and RNA are pending.  Would strongly consider the possibility that his psychiatric rx may be contributing to this.  Stop doxy and vanco Mom wants pancreatic enzymes checked Suspect her loose BM are antibiotic related, await GI panel.  Check HgB A1C with elevated Glc (borderline)  Intertriginous rash Start mycelex  Thank you so much for this interesting consult,   Bobby Rumpf (pager) [phone] [email or web address]  Todd Burton is an 19 y.o. male.  HPI: 20 yo M with schizoaffective d/ ( on multiple meds), adm on 8-26 with 2 weeks of n/v, occasionally proceeded by cough. He did not have f/c at home. His vitals (BP and HR) improved in ED with hydration.  He was started on flagyl and ceftriaxone on adm . He had a very active UA however his UCx was negative. His BCx have been negative. Due to persistent fever, he was changed to vanco/zosyn/doxy today.  He had increase in his WBC today (13 --> 17) as well as atypical lymphocytes.  He has TSH normal, elevated CMV IgG/M   Past Medical History:  Diagnosis Date  . Obesity   . Schizoaffective disorder ([place])     History reviewed. No pertinent surgical history.   No Known Allergies  Medications:  Scheduled: . benztropine  0.5 mg Oral QHS  . doxycycline  100 mg Oral Q12H  . enoxaparin (LOVENOX) injection  80 mg Subcutaneous Q24H  . FLUoxetine  40 mg Oral QHS  . fluPHENAZine  15 mg Oral QHS  . folic acid  1 mg Oral Daily  . piperacillin-tazobactam (ZOSYN)  IV  3.375 g Intravenous Q8H  . sodium  chloride flush  10-40 mL Intracatheter Q12H  . sodium chloride flush  3 mL Intravenous Q12H  . topiramate  50 mg Oral QHS  . trazodone  300 mg Oral QHS  . vancomycin  1,500 mg Intravenous Q8H    Abtx:  Anti-infectives    Start     Dose/Rate Route Frequency Ordered Stop   06/29/16 1800  vancomycin (VANCOCIN) 1,500 mg in sodium chloride 0.9 % 500 mL IVPB     1,500 mg 250 mL/hr over 120 Minutes Intravenous Every 8 hours 06/29/16 0924     06/29/16 1000  vancomycin (VANCOCIN) 2,000 mg in sodium chloride 0.9 % 500 mL IVPB     2,000 mg 250 mL/hr over 120 Minutes Intravenous  Once 06/29/16 0924     06/29/16 1000  piperacillin-tazobactam (ZOSYN) IVPB 3.375 g     3.375 g 12.5 mL/hr over 240 Minutes Intravenous Every 8 hours 06/29/16 0924     06/28/16 2200  doxycycline (VIBRA-TABS) tablet 100 mg     100 mg Oral Every 12 hours 06/28/16 1711     06/28/16 0600  cefTRIAXone (ROCEPHIN) 1 g in dextrose 5 % 50 mL IVPB  Status:  Discontinued     1 g 100 mL/hr over 30 Minutes Intravenous Every 12 hours 06/28/16 0145 06/28/16 1711   06/28/16 0600  metroNIDAZOLE (FLAGYL) IVPB 500 mg  Status:  Discontinued     500 mg 100 mL/hr over 60 Minutes  Intravenous Every 6 hours 06/28/16 0522 06/28/16 1711   06/28/16 0500  metroNIDAZOLE (FLAGYL) IVPB 1 g  Status:  Discontinued     1 g 200 mL/hr over 60 Minutes Intravenous Every 8 hours 06/28/16 0455 06/28/16 0522   06/27/16 1500  cefTRIAXone (ROCEPHIN) 1 g in dextrose 5 % 50 mL IVPB     1 g 100 mL/hr over 30 Minutes Intravenous  Once 06/27/16 1445 06/27/16 1547      Total days of antibiotics: 2 vanco/zosyn/doxy          Social History:  reports that he has been smoking.  He has never used smokeless tobacco. He reports that he drinks alcohol. He reports that he does not use drugs.  Family History  Problem Relation Age of Onset  . Pancreatic cancer Maternal Grandfather   . Pancreatic cancer Paternal Grandmother     General ROS: no dysphagia or oral  ulcers, no cough, loose BM x2 last night, x2  this AM. no abdl pain. see HPI. '  Blood pressure 132/73, pulse (!) 111, temperature 98.9 F (37.2 C), resp. rate 18, height 6' (1.829 m), weight (!) 158.8 kg (350 lb), SpO2 93 %. General appearance: alert, cooperative and no distress Eyes: negative findings: conjunctivae and sclerae normal, pupils equal, round, reactive to light and accomodation and no photophobia Throat: normal findings: oropharynx pink & moist without lesions or evidence of thrush and no ulcers Neck: no adenopathy, supple, symmetrical, trachea midline and FROM.  Lungs: clear to auscultation bilaterally Heart: regular rate and rhythm Abdomen: normal findings: bowel sounds normal and soft, non-tender Male genitalia: abnormal findings: R groin intertrigious rash, candida Extremities: non-pitting edema.    Results for orders placed or performed during the hospital encounter of 06/27/16 (from the past 48 hour(s))  Blood Culture (routine x 2)     Status: None (Preliminary result)   Collection Time: 06/27/16  3:00 PM  Result Value Ref Range   Specimen Description BLOOD LEFT ARM    Special Requests BOTTLES DRAWN AEROBIC AND ANAEROBIC 5CC EACH    Culture      NO GROWTH 2 DAYS Performed at [facility]    Report Status PENDING   I-Stat CG4 Lactic Acid, ED  (not at  [facility])     Status: None   Collection Time: 06/27/16  3:17 PM  Result Value Ref Range   Lactic Acid, Venous 1.78 0.5 - 1.9 mmol/L  Blood Culture (routine x 2)     Status: None (Preliminary result)   Collection Time: 06/27/16  3:20 PM  Result Value Ref Range   Specimen Description BLOOD LEFT HAND    Special Requests BOTTLES DRAWN AEROBIC AND ANAEROBIC 5CC EACH    Culture      NO GROWTH 2 DAYS Performed at [facility]    Report Status PENDING   Rapid strep screen     Status: None   Collection Time: 06/27/16  4:15 PM  Result Value Ref Range   Streptococcus, Group A Screen (Direct) NEGATIVE NEGATIVE     Comment: (NOTE) A Rapid Antigen test may result negative if the antigen level in the sample is below the detection level of this test. The FDA has not cleared this test as a stand-alone test therefore the rapid antigen negative result has reflexed to a Group A Strep culture.   Culture, group A strep     Status: None (Preliminary result)   Collection Time: 06/27/16  4:15 PM  Result Value Ref Range  Specimen Description THROAT    Special Requests NONE Reflexed from [id number]    Culture      TOO YOUNG TO READ Performed at [facility]    Report Status PENDING   I-Stat CG4 Lactic Acid, ED  (not at  [facility])     Status: None   Collection Time: 06/27/16  8:10 PM  Result Value Ref Range   Lactic Acid, Venous 1.28 0.5 - 1.9 mmol/L  Comprehensive metabolic panel     Status: Abnormal   Collection Time: 06/28/16 12:48 AM  Result Value Ref Range   Sodium 132 (L) 135 - 145 mmol/L   Potassium 3.4 (L) 3.5 - 5.1 mmol/L   Chloride 101 101 - 111 mmol/L   CO2 24 22 - 32 mmol/L   Glucose, Bld 112 (H) 65 - 99 mg/dL   BUN 7 6 - 20 mg/dL   Creatinine, Ser 1.27 (H) 0.61 - 1.24 mg/dL   Calcium 7.7 (L) 8.9 - 10.3 mg/dL   Total Protein 5.9 (L) 6.5 - 8.1 g/dL   Albumin 2.3 (L) 3.5 - 5.0 g/dL   AST 97 (H) 15 - 41 U/L   ALT 68 (H) 17 - 63 U/L   Alkaline Phosphatase 47 38 - 126 U/L   Total Bilirubin 1.1 0.3 - 1.2 mg/dL   GFR calc non Af Amer >60 >60 mL/min   GFR calc Af Amer >60 >60 mL/min    Comment: (NOTE) The eGFR has been calculated using the CKD EPI equation. This calculation has not been validated in all clinical situations. eGFR's persistently <60 mL/min signify possible Chronic Kidney Disease.    Anion gap 7 5 - 15  CBC with Differential/Platelet     Status: Abnormal   Collection Time: 06/28/16 12:48 AM  Result Value Ref Range   WBC 13.0 (H) 4.0 - 10.5 K/uL   RBC 3.71 (L) 4.22 - 5.81 MIL/uL   Hemoglobin 11.7 (L) 13.0 - 17.0 g/dL   HCT 35.0 (L) 39.0 - 52.0 %   MCV 94.3 78.0 - 100.0  fL   MCH 31.5 26.0 - 34.0 pg   MCHC 33.4 30.0 - 36.0 g/dL   RDW 12.8 11.5 - 15.5 %   Platelets 209 150 - 400 K/uL   Neutrophils Relative % 49 %   Lymphocytes Relative 39 %   Monocytes Relative 8 %   Eosinophils Relative 2 %   Basophils Relative 2 %   Neutro Abs 6.3 1.7 - 7.7 K/uL   Lymphs Abs 5.1 (H) 0.7 - 4.0 K/uL   Monocytes Absolute 1.0 0.1 - 1.0 K/uL   Eosinophils Absolute 0.3 0.0 - 0.7 K/uL   Basophils Absolute 0.3 (H) 0.0 - 0.1 K/uL   RBC Morphology POLYCHROMASIA PRESENT    WBC Morphology ATYPICAL LYMPHOCYTES   Magnesium     Status: None   Collection Time: 06/28/16 12:48 AM  Result Value Ref Range   Magnesium 2.0 1.7 - 2.4 mg/dL  Phosphorus     Status: None   Collection Time: 06/28/16 12:48 AM  Result Value Ref Range   Phosphorus 2.9 2.5 - 4.6 mg/dL  CBC WITH DIFFERENTIAL     Status: Abnormal   Collection Time: 06/28/16  6:13 AM  Result Value Ref Range   WBC 13.1 (H) 4.0 - 10.5 K/uL   RBC 3.93 (L) 4.22 - 5.81 MIL/uL   Hemoglobin 12.2 (L) 13.0 - 17.0 g/dL   HCT 38.1 (L) 39.0 - 52.0 %   MCV 96.9 78.0 - 100.0  fL   MCH 31.0 26.0 - 34.0 pg   MCHC 32.0 30.0 - 36.0 g/dL   RDW 12.6 11.5 - 15.5 %   Platelets 192 150 - 400 K/uL   Neutrophils Relative % 47 %   Lymphocytes Relative 37 %   Monocytes Relative 10 %   Eosinophils Relative 2 %   Basophils Relative 4 %   Neutro Abs 6.2 1.7 - 7.7 K/uL   Lymphs Abs 4.8 (H) 0.7 - 4.0 K/uL   Monocytes Absolute 1.3 (H) 0.1 - 1.0 K/uL   Eosinophils Absolute 0.3 0.0 - 0.7 K/uL   Basophils Absolute 0.5 (H) 0.0 - 0.1 K/uL   WBC Morphology ATYPICAL LYMPHOCYTES   Comprehensive metabolic panel     Status: Abnormal   Collection Time: 06/28/16  6:13 AM  Result Value Ref Range   Sodium 135 135 - 145 mmol/L   Potassium 3.6 3.5 - 5.1 mmol/L   Chloride 102 101 - 111 mmol/L   CO2 25 22 - 32 mmol/L   Glucose, Bld 105 (H) 65 - 99 mg/dL   BUN 7 6 - 20 mg/dL   Creatinine, Ser 1.23 0.61 - 1.24 mg/dL   Calcium 7.9 (L) 8.9 - 10.3 mg/dL   Total  Protein 5.9 (L) 6.5 - 8.1 g/dL   Albumin 2.2 (L) 3.5 - 5.0 g/dL   AST 97 (H) 15 - 41 U/L   ALT 72 (H) 17 - 63 U/L   Alkaline Phosphatase 50 38 - 126 U/L   Total Bilirubin 1.1 0.3 - 1.2 mg/dL   GFR calc non Af Amer >60 >60 mL/min   GFR calc Af Amer >60 >60 mL/min    Comment: (NOTE) The eGFR has been calculated using the CKD EPI equation. This calculation has not been validated in all clinical situations. eGFR's persistently <60 mL/min signify possible Chronic Kidney Disease.    Anion gap 8 5 - 15  Mononucleosis screen     Status: None   Collection Time: 06/28/16  9:03 AM  Result Value Ref Range   Mono Screen NEGATIVE NEGATIVE  Technologist smear review     Status: None   Collection Time: 06/28/16  9:03 AM  Result Value Ref Range   Tech Review ATYPICAL LYMPHOCYTES   Lactate dehydrogenase     Status: Abnormal   Collection Time: 06/28/16  6:05 PM  Result Value Ref Range   LDH 574 (H) 98 - 192 U/L  Ferritin     Status: Abnormal   Collection Time: 06/28/16  6:05 PM  Result Value Ref Range   Ferritin 542 (H) 24 - 336 ng/mL  Folate     Status: Abnormal   Collection Time: 06/28/16  6:05 PM  Result Value Ref Range   Folate 5.2 (L) >5.9 ng/mL  Iron and TIBC     Status: Abnormal   Collection Time: 06/28/16  6:05 PM  Result Value Ref Range   Iron 37 (L) 45 - 182 ug/dL   TIBC 231 (L) 250 - 450 ug/dL   Saturation Ratios 16 (L) 17.9 - 39.5 %   UIBC 194 ug/dL  TSH     Status: None   Collection Time: 06/28/16  6:05 PM  Result Value Ref Range   TSH 2.131 0.350 - 4.500 uIU/mL  Vitamin B12     Status: None   Collection Time: 06/28/16  6:05 PM  Result Value Ref Range   Vitamin B-12 496 180 - 914 pg/mL    Comment: (NOTE) This assay is not  validated for testing neonatal or myeloproliferative syndrome specimens for Vitamin B12 levels.   T4, free     Status: Abnormal   Collection Time: 06/28/16  6:05 PM  Result Value Ref Range   Free T4 1.15 (H) 0.61 - 1.12 ng/dL    Comment:  (NOTE) Biotin ingestion may interfere with free T4 tests. If the results are inconsistent with the TSH level, previous test results, or the clinical presentation, then consider biotin interference. If needed, order repeat testing after stopping biotin.   CMV IgM     Status: Abnormal   Collection Time: 06/28/16  6:05 PM  Result Value Ref Range   CMV IgM 118.0 (H) 0.0 - 29.9 AU/mL    Comment: (NOTE)                                Negative         <30.0                                Equivocal  30.0 - 34.9                                Positive         >34.9 A positive result is generally indicative of acute infection, reactivation or persistent IgM production. Performed At: [facility] [place], [place] [id number] Lindon Romp MD [id number]   Cmv antibody, IgG (EIA)     Status: Abnormal   Collection Time: 06/28/16  6:05 PM  Result Value Ref Range   CMV Ab - IgG 4.00 (H) 0.00 - 0.59 U/mL    Comment: (NOTE)                               Negative          <0.60                               Equivocal   0.60 - 0.69                               Positive          >0.69 Performed At: [facility] [address] [place], [place] [id number] Lindon Romp MD [id number]   Hepatitis panel, acute     Status: None   Collection Time: 06/28/16  6:05 PM  Result Value Ref Range   Hepatitis B Surface Ag Negative Negative   HCV Ab <0.1 0.0 - 0.9 s/co ratio    Comment: (NOTE)                                  Negative:     < 0.8                             Indeterminate: 0.8 - 0.9  Positive:     > 0.9 The CDC recommends that a positive HCV antibody result be followed up with a HCV Nucleic Acid Amplification test (270350). Performed At: [facility] [place], [place] [id number] Lindon Romp MD [id number]    Hep A IgM Negative Negative   Hep B C IgM Negative Negative  Uric acid      Status: None   Collection Time: 06/28/16  6:05 PM  Result Value Ref Range   Uric Acid, Serum 6.1 4.4 - 7.6 mg/dL  Troponin I     Status: None   Collection Time: 06/28/16  6:05 PM  Result Value Ref Range   Troponin I <0.03 <0.03 ng/mL  Urine rapid drug screen (hosp performed)     Status: None   Collection Time: 06/28/16 10:44 PM  Result Value Ref Range   Opiates NONE DETECTED NONE DETECTED   Cocaine NONE DETECTED NONE DETECTED   Benzodiazepines NONE DETECTED NONE DETECTED   Amphetamines NONE DETECTED NONE DETECTED   Tetrahydrocannabinol NONE DETECTED NONE DETECTED   Barbiturates NONE DETECTED NONE DETECTED    Comment:        DRUG SCREEN FOR MEDICAL PURPOSES ONLY.  IF CONFIRMATION IS NEEDED FOR ANY PURPOSE, NOTIFY LAB WITHIN 5 DAYS.        LOWEST DETECTABLE LIMITS FOR URINE DRUG SCREEN Drug Class       Cutoff (ng/mL) Amphetamine      1000 Barbiturate      200 Benzodiazepine   017 Tricyclics       510 Opiates          300 Cocaine          300 THC              50   Basic metabolic panel     Status: Abnormal   Collection Time: 06/29/16  4:21 AM  Result Value Ref Range   Sodium 132 (L) 135 - 145 mmol/L   Potassium 3.7 3.5 - 5.1 mmol/L   Chloride 102 101 - 111 mmol/L   CO2 24 22 - 32 mmol/L   Glucose, Bld 101 (H) 65 - 99 mg/dL   BUN 9 6 - 20 mg/dL   Creatinine, Ser 1.32 (H) 0.61 - 1.24 mg/dL   Calcium 7.9 (L) 8.9 - 10.3 mg/dL   GFR calc non Af Amer >60 >60 mL/min   GFR calc Af Amer >60 >60 mL/min    Comment: (NOTE) The eGFR has been calculated using the CKD EPI equation. This calculation has not been validated in all clinical situations. eGFR's persistently <60 mL/min signify possible Chronic Kidney Disease.    Anion gap 6 5 - 15  CBC     Status: Abnormal   Collection Time: 06/29/16  4:21 AM  Result Value Ref Range   WBC 17.2 (H) 4.0 - 10.5 K/uL   RBC 3.71 (L) 4.22 - 5.81 MIL/uL   Hemoglobin 11.4 (L) 13.0 - 17.0 g/dL   HCT 35.8 (L) 39.0 - 52.0 %   MCV 96.5 78.0 -  100.0 fL   MCH 30.7 26.0 - 34.0 pg   MCHC 31.8 30.0 - 36.0 g/dL   RDW 12.9 11.5 - 15.5 %   Platelets 214 150 - 400 K/uL      Component Value Date/Time   SDES THROAT 06/27/2016 1615   SPECREQUEST NONE Reflexed from [id number] 06/27/2016 1615   CULT  06/27/2016 1615    TOO YOUNG TO READ Performed at [facility]  REPTSTATUS PENDING 06/27/2016 1615   Dg Chest 2 View  Result Date: 06/27/2016 CLINICAL DATA:  Sepsis with cough EXAM: CHEST  2 VIEW COMPARISON:  None. FINDINGS: Heart and mediastinal contours are within normal limits. No focal opacities or effusions. No acute bony abnormality. IMPRESSION: No active cardiopulmonary disease. Electronically Signed   By: Rolm Baptise M.D.   On: 06/27/2016 14:57   Ct Angio Chest Pe W Or Wo Contrast  Result Date: 06/28/2016 CLINICAL DATA:  Pt states he has not been able to keep anything on his stomach for 2 weeks. He denies pain but his breathing is difficult EXAM: CT ANGIOGRAPHY CHEST WITH CONTRAST TECHNIQUE: Multidetector CT imaging of the chest was performed using the standard protocol during bolus administration of intravenous contrast. Multiplanar CT image reconstructions and MIPs were obtained to evaluate the vascular anatomy. CONTRAST:  Isovue 370 128m COMPARISON:  CT abdomen 06/27/2016 FINDINGS: Vascular: Right arm IV contrast administration. The SVC is patent. Right atrium and right ventricle nondilated. Satisfactory opacification of pulmonary arteries noted, and there is no evidence of pulmonary emboli. Motion degrades some images through pulmonary artery branches. Patent bilateral pulmonary veins drain into the left atrium. Adequate contrast opacification of the thoracic aorta with no evidence of dissection, aneurysm, or stenosis. There is classic 3-vessel brachiocephalic arch anatomy without proximal stenosis. Mediastinum/Lymph Nodes: Subcentimeter prevascular, precarinal, and right paratracheal nodes. No definite hilar adenopathy. No  pericardial effusion. Lungs/Pleura: Breathing motion degrades evaluation. No confluent infiltrate or nodule is evident. No pneumothorax. No pleural effusion. Upper abdomen: Small hiatal hernia. Stable borderline periportal adenopathy. No acute findings. Musculoskeletal: Mid thoracic partially calcified protrusions. Negative for fracture. Review of the MIP images confirms the above findings. IMPRESSION: 1. Negative for acute PE or thoracic aortic dissection. 2. Small hiatal hernia. Electronically Signed   By: DLucrezia EuropeM.D.   On: 06/28/2016 13:54   Ct Abdomen Pelvis W Contrast  Result Date: 06/27/2016 CLINICAL DATA:  Vomiting for 2 weeks. EXAM: CT ABDOMEN AND PELVIS WITH CONTRAST TECHNIQUE: Multidetector CT imaging of the abdomen and pelvis was performed using the standard protocol following bolus administration of intravenous contrast. CONTRAST:  1036mISOVUE-300 IOPAMIDOL (ISOVUE-300) INJECTION 61% COMPARISON:  None. FINDINGS: Lower chest: Lung bases are clear. Hepatobiliary: No focal hepatic lesion. No biliary duct dilatation. Gallbladder is normal. Common bile duct is normal. Pancreas: Pancreas is normal. No ductal dilatation. No pancreatic inflammation. Spleen: Normal spleen Adrenals/urinary tract: Adrenal glands and kidneys are normal. The ureters and bladder normal. Stomach/Bowel: Stomach, small bowel, appendix, and cecum are normal. The colon and rectosigmoid colon are normal. Vascular/Lymphatic: Abdominal aorta is normal caliber. There is no retroperitoneal or periportal lymphadenopathy. No pelvic lymphadenopathy. There are scattered periaortic and periportal lymph nodes not pathologic by size. LEFT external iliac lymph node measures 7 mm short axis (image 80, series 2). RIGHT inguinal lymph node measures 10 mm. Reproductive: Prostate normal Other: No free fluid. Musculoskeletal: No aggressive osseous lesion. IMPRESSION: 1. No acute abdominal pelvic findings. 2. Mildly prominent retroperitoneal and liac  lymph nodes are not pathologic by size criteria and favored benign. Electronically Signed   By: StSuzy Bouchard.D.   On: 06/27/2016 19:07   Recent Results (from the past 240 hour(s))  Urine culture     Status: Abnormal   Collection Time: 06/27/16  1:20 PM  Result Value Ref Range Status   Specimen Description URINE, RANDOM  Final   Special Requests NONE  Final   Culture (A)  Final    <10,000 COLONIES/mL  INSIGNIFICANT GROWTH Performed at [facility]    Report Status 06/28/2016 FINAL  Final  Blood Culture (routine x 2)     Status: None (Preliminary result)   Collection Time: 06/27/16  3:00 PM  Result Value Ref Range Status   Specimen Description BLOOD LEFT ARM  Final   Special Requests BOTTLES DRAWN AEROBIC AND ANAEROBIC 5CC EACH  Final   Culture   Final    NO GROWTH 2 DAYS Performed at [facility]    Report Status PENDING  Incomplete  Blood Culture (routine x 2)     Status: None (Preliminary result)   Collection Time: 06/27/16  3:20 PM  Result Value Ref Range Status   Specimen Description BLOOD LEFT HAND  Final   Special Requests BOTTLES DRAWN AEROBIC AND ANAEROBIC 5CC EACH  Final   Culture   Final    NO GROWTH 2 DAYS Performed at [facility]    Report Status PENDING  Incomplete  Rapid strep screen     Status: None   Collection Time: 06/27/16  4:15 PM  Result Value Ref Range Status   Streptococcus, Group A Screen (Direct) NEGATIVE NEGATIVE Final    Comment: (NOTE) A Rapid Antigen test may result negative if the antigen level in the sample is below the detection level of this test. The FDA has not cleared this test as a stand-alone test therefore the rapid antigen negative result has reflexed to a Group A Strep culture.   Culture, group A strep     Status: None (Preliminary result)   Collection Time: 06/27/16  4:15 PM  Result Value Ref Range Status   Specimen Description THROAT  Final   Special Requests NONE Reflexed from [id number]  Final   Culture    Final    TOO YOUNG TO READ Performed at [facility]    Report Status PENDING  Incomplete      06/29/2016, 1:24 PM     LOS: 1 day    Records and images were personally reviewed where available.

## 2016-06-29 NOTE — Progress Notes (Signed)
Patient has home BiPAP unit with him at the bedside.  Patient stated he didn't need any help with placement of his mask.  Pt stated he didn't need anything at this time .  RT will continue to monitor.

## 2016-06-29 NOTE — Progress Notes (Signed)
Pharmacy Antibiotic Note  Todd Burton is a 20 y.o. male admitted on 06/27/2016 with sepsis.  Pharmacy has been consulted for Vancomycin/Zosyn dosing.  Plan: -Vancomycin 2gm x1 -Vancomycin 1500 mg q8h per nomogram -Zosyn 3.375 g q8h -Monitor LOT, Cx data, VT as indicated   Height: 6' (182.9 cm) Weight: (!) 350 lb (158.8 kg) IBW/kg (Calculated) : 77.6  Temp (24hrs), Avg:99.8 F (37.7 C), Min:98.4 F (36.9 C), Max:101.3 F (38.5 C)   Recent Labs Lab 06/27/16 1250 06/27/16 1517 06/27/16 2010 06/28/16 0048 06/28/16 0613 06/29/16 0421  WBC 16.1*  --   --  13.0* 13.1* 17.2*  CREATININE 1.31*  --   --  1.27* 1.23 1.32*  LATICACIDVEN  --  1.78 1.28  --   --   --     Estimated Creatinine Clearance: 139 mL/min (by C-G formula based on SCr of 1.32 mg/dL).    No Known Allergies  Antimicrobials this admission: 8/27 Vancomycin >> 8/27 Zosyn >> 8/26 Doxycycline >> 8/26 Metronidazole >> 8/26 8/25 ceftriaxone >> 8/26  Dose adjustments this admission: n/a  Microbiology results: 8/25 BCx: ngtd 8/25: strep test IP 8/26: [place] panel IP  Thank you for allowing pharmacy to be a part of this patient's care.  Fredonia HighlandMichael Bitonti, PharmD PGY-1 Pharmacy Resident Pager: [phone] 06/29/2016  I discussed / reviewed the pharmacy note by Dr. Malachi CarlBitonti and I agree with the resident's findings and plans as documented.  Toys 'R' UsKimberly Kayveon Lennartz, Pharm.D., BCPS Clinical Pharmacist Pager [phone] 06/29/2016 11:24 AM

## 2016-06-29 NOTE — Progress Notes (Signed)
  Echocardiogram 2D Echocardiogram has been performed.  [name], Todd Burton 06/29/2016, 2:48 PM

## 2016-06-30 DIAGNOSIS — B2790 Infectious mononucleosis, unspecified without complication: Secondary | ICD-10-CM

## 2016-06-30 LAB — CBC
HEMATOCRIT: 34.9 % — AB (ref 39.0–52.0)
HEMOGLOBIN: 11.1 g/dL — AB (ref 13.0–17.0)
MCH: 30.7 pg (ref 26.0–34.0)
MCHC: 31.8 g/dL (ref 30.0–36.0)
MCV: 96.7 fL (ref 78.0–100.0)
Platelets: 199 10*3/uL (ref 150–400)
RBC: 3.61 MIL/uL — ABNORMAL LOW (ref 4.22–5.81)
RDW: 13.1 % (ref 11.5–15.5)
WBC: 19.3 10*3/uL — AB (ref 4.0–10.5)

## 2016-06-30 LAB — BASIC METABOLIC PANEL
ANION GAP: 7 (ref 5–15)
BUN: 9 mg/dL (ref 6–20)
CHLORIDE: 104 mmol/L (ref 101–111)
CO2: 23 mmol/L (ref 22–32)
Calcium: 7.9 mg/dL — ABNORMAL LOW (ref 8.9–10.3)
Creatinine, Ser: 1.43 mg/dL — ABNORMAL HIGH (ref 0.61–1.24)
GFR calc Af Amer: >60 mL/min (ref >60)
GFR calc non Af Amer: >60 mL/min (ref >60)
GLUCOSE: 103 mg/dL — AB (ref 65–99)
POTASSIUM: 3.9 mmol/L (ref 3.5–5.1)
Sodium: 134 mmol/L — ABNORMAL LOW (ref 135–145)

## 2016-06-30 LAB — URINE MICROSCOPIC-ADD ON

## 2016-06-30 LAB — URINALYSIS, ROUTINE W REFLEX MICROSCOPIC
GLUCOSE, UA: NEGATIVE mg/dL
KETONES UR: NEGATIVE mg/dL
LEUKOCYTES UA: NEGATIVE
Nitrite: NEGATIVE
PH: 6 (ref 5.0–8.0)
Protein, ur: >300 mg/dL — AB
SPECIFIC GRAVITY, URINE: 1.016 (ref 1.005–1.030)

## 2016-06-30 LAB — PATHOLOGIST SMEAR REVIEW: PATH REVIEW: REACTIVE

## 2016-06-30 LAB — HEMOGLOBIN A1C
Hgb A1c MFr Bld: 4.8 % (ref 4.8–5.6)
Mean Plasma Glucose: 91 mg/dL

## 2016-06-30 LAB — EPSTEIN-BARR VIRUS VCA ANTIBODY PANEL
EBV EARLY ANTIGEN AB, IGG: 59.3 U/mL — AB (ref 0.0–8.9)
EBV NA IGG: 369 U/mL — AB (ref 0.0–17.9)
EBV VCA IGG: 229 U/mL — AB (ref 0.0–17.9)
EBV VCA IgM: >160 U/mL — ABNORMAL HIGH (ref 0.0–35.9)

## 2016-06-30 MED ORDER — SODIUM CHLORIDE 0.9 % IV BOLUS (SEPSIS)
500.0000 mL | Freq: Once | INTRAVENOUS | Status: AC
  Administered 2016-06-30: 500 mL via INTRAVENOUS

## 2016-06-30 MED ORDER — GI COCKTAIL ~~LOC~~
30.0000 mL | Freq: Once | ORAL | Status: AC
  Administered 2016-06-30: 30 mL via ORAL
  Filled 2016-06-30: qty 30

## 2016-06-30 NOTE — Progress Notes (Signed)
Late entry for 06/30/16 0000  Patient complained of chest pain.  Paged Dr. EKG done.  GI  Cocktail ordered.  Patient Vital signs WNL with the exception of a temp of 101.5.  Tylenol given.

## 2016-06-30 NOTE — Progress Notes (Signed)
PROGRESS NOTE  Todd Burton  BJY:[id number] DOB: Jun 17, 1996 DOA: 06/27/2016 PCP: Everrett Coombe, DO  Brief Narrative:   Todd Burton is a 20 y.o. male with schizoaffective disorder who presented to the emergency department with a two-week history of nausea, vomiting, particularly after meals.  He has some cough which would sometimes precede his vomiting.  He denies fever, chills, abdominal pain, diarrhea, constipation, melena or hematochezia, dysuria, frequency, hematuria, sore throat, cough, chest pain, dyspnea, palpitations, dizziness, diaphoresis, pitting edema of the lower extremities, PND or orthopnea. In the ER, he was hypotensive, tachypneic, tachycardic. Blood pressure normalized after the patient received 3 L of normal saline, but the tachycardia, although better, has persisted.  He developed a fever to 101.46F shortly after admission.  Labs were concerning for leukocytosis with lymphocytosis with atypical lymphocytes.  Duration of his symptoms is unusual for typical viral infection.  He has also been persistently tachycardic out of proportion to his fever and the amount of IVF he has received.  Monospot negative.    Assessment & Plan:   Principal Problem:   SIRS (systemic inflammatory response syndrome) (HCC) Active Problems:   UTI (urinary tract infection)   Tachycardia   Hypokalemia   Schizoaffective disorder (HCC)   Tachypnea  Sepsis physiology but unclear source.  Two weeks of nausea prior to admission followed by fever and now diarrhea.  WBC increased dramatically this morning.  Also consider drug reaction:  No muscle rigidity or clonus.  Medications stable for many months.  No family history of autoimmune disease.  -  Rash resolved -  GI pathogen panel if further diarrhea -  Urine culture:  Insignificant growth -  Rapid strep negative -  Monospot negative -  HIV quant RNA negative -  CMV IgM, IgG POSITIVE -  Acute hepatitis panel negative -  RMSF panel pending -  TSH wnl  and free t4 only marginally elevated, not the explanation for his symptoms -  Blood cultures NGTD -  Case discussed with psychiatry who stated that there is a risk for vomiting and diarrhea with some of his medications, but usually right after they are started.  Neuroleptic malignant syndrome and serotonin syndrome can cause vital sign instability, fevers, etc, but are associated with clonus and rigidity, neither of which he has at this time.  Will check a CK.  If any concern for NMS or SS, it is okay to stop his medications "cold [place]" but given his presentation, low suspicion at this time. -  Appreciate ID assistace  Sinus tachycardia, out of proportion to his degree of dehydration and fever.  ddx includes myocarditis and hyperthyroidism -  Urine drug screen negative -  CT angio negative for PE -  TSH and free T4 near normal -  Troponin negative -  ECHO with preserved EF    Hypokalemia, resolved with supplementation    Schizoaffective disorder (HCC) Continue Cogentin nightly  Continue Prozac 40 mg a day. Continue fluphenazine 15 mg by mouth daily. Continue Topamax 50 mg by mouth at bedtime. Continue trazodone 300 mg by mouth at bedtime. Continue Strattera 10 mg by mouth daily. Continue Risperdal 0.25 mg at bedtime.  OSA, encouraged him to the use the bipap machine which is at bedside  Morbid obesity -  Had some weight loss over last two weeks because not able to eat  Leukocytosis continues to worsen despite antibiotics suggesting viral infection -  Appreciate oncology assistance, Dr. Myna Hidalgo  Normocytic anemia, possible mild hemolysis given elevated LDH.  No schistocytes seen on smear -  Iron studies, B12 wnl -  Folate mildly low > start folate supplementation -  Repeat hgb in AM   DVT prophylaxis:  lovenox Code Status:  full Family Communication:  Patient and mother at bedside Disposition Plan:  Improvement in tachycardia and fevers improving   Consultants:   Dr.  Ninetta Lights, ID  Dr. Myna Hidalgo, ONC  Psychiatry by phone  Procedures:  none  Antimicrobials:   Ceftriaxone and flagyl8/25 > 8/26  Doxycycline 8/26  Vancomycin and zosyn 8/27 >    Subjective:  States that he feels tired, but otherwise well.  Able to eat some yesterday.  No diarrhea since yesterday.    Objective: Vitals:   06/29/16 2200 06/30/16 0007 06/30/16 0613 06/30/16 1436  BP: (!) 126/52 125/77 122/78 122/68  Pulse: (!) 118 (!) 129 (!) 129 (!) 118  Resp: (!) 32 (!) 26 (!) 35 20  Temp: 99.4 F (37.4 C) (!) 101.2 F (38.4 C) 98 F (36.7 C) 98.3 F (36.8 C)  TempSrc: Oral Oral Oral Oral  SpO2: 96% 97% 94% 100%  Weight:      Height:        Intake/Output Summary (Last 24 hours) at 06/30/16 1519 Last data filed at 06/30/16 1419  Gross per 24 hour  Intake             1670 ml  Output              800 ml  Net              870 ml   Filed Weights   06/27/16 1236 06/27/16 1525 06/27/16 2254  Weight: 127 kg (280 lb) (!) 158.8 kg (350 lb) (!) 158.8 kg (350 lb)    Examination:  General exam:  Adult male, tachypneic  HEENT:  NCAT, MMM Respiratory system: Clear to auscultation bilaterally Cardiovascular system: tachycardic, regular rhythm, normal S1/S2. No murmurs, rubs, gallops or clicks.  Warm extremities Gastrointestinal system: Normal active bowel sounds, soft, nondistended, nontender. MSK:  Normal tone and bulk, no lower extremity edema Neuro:  Grossly intact Skin:  No purpura or hives.  No excoriations.      Data Reviewed: I have personally reviewed following labs and imaging studies  CBC:  Recent Labs Lab 06/27/16 1250 06/28/16 0048 06/28/16 0613 06/29/16 0421 06/30/16 0437  WBC 16.1* 13.0* 13.1* 17.2* 19.3*  NEUTROABS 7.4 6.3 6.2 7.6  --   HGB 12.9* 11.7* 12.2* 11.4* 11.1*  HCT 38.4* 35.0* 38.1* 35.8* 34.9*  MCV 94.1 94.3 96.9 96.5 96.7  PLT 233 209 192 214 199   Basic Metabolic Panel:  Recent Labs Lab 06/27/16 1250 06/28/16 0048  06/28/16 0613 06/29/16 0421 06/30/16 0437  NA 132* 132* 135 132* 134*  K 3.5 3.4* 3.6 3.7 3.9  CL 99* 101 102 102 104  CO2 25 24 25 24 23   GLUCOSE 117* 112* 105* 101* 103*  BUN 9 7 7 9 9   CREATININE 1.31* 1.27* 1.23 1.32* 1.43*  CALCIUM 8.2* 7.7* 7.9* 7.9* 7.9*  MG  --  2.0  --   --   --   PHOS  --  2.9  --   --   --    GFR: Estimated Creatinine Clearance: 128.3 mL/min (by C-G formula based on SCr of 1.43 mg/dL). Liver Function Tests:  Recent Labs Lab 06/27/16 1250 06/28/16 0048 06/28/16 0613  AST 109* 97* 97*  ALT 72* 68* 72*  ALKPHOS 58 47 50  BILITOT 1.2  1.1 1.1  PROT 7.3 5.9* 5.9*  ALBUMIN 2.9* 2.3* 2.2*    Recent Labs Lab 06/29/16 1410  LIPASE 18  AMYLASE 45   No results for input(s): AMMONIA in the last 168 hours. Coagulation Profile: No results for input(s): INR, PROTIME in the last 168 hours. Cardiac Enzymes:  Recent Labs Lab 06/28/16 1805 06/29/16 1830  CKTOTAL  --  875*  TROPONINI <0.03  --    BNP (last 3 results) No results for input(s): PROBNP in the last 8760 hours. HbA1C:  Recent Labs  06/29/16 1450  HGBA1C 4.8   CBG: No results for input(s): GLUCAP in the last 168 hours. Lipid Profile: No results for input(s): CHOL, HDL, LDLCALC, TRIG, CHOLHDL, LDLDIRECT in the last 72 hours. Thyroid Function Tests:  Recent Labs  06/28/16 1805  TSH 2.131  FREET4 1.15*   Anemia Panel:  Recent Labs  06/28/16 1805  VITAMINB12 496  FOLATE 5.2*  FERRITIN 542*  TIBC 231*  IRON 37*   Urine analysis:    Component Value Date/Time   COLORURINE AMBER (A) 06/30/2016 1441   APPEARANCEUR CLEAR 06/30/2016 1441   LABSPEC 1.016 06/30/2016 1441   PHURINE 6.0 06/30/2016 1441   GLUCOSEU NEGATIVE 06/30/2016 1441   HGBUR MODERATE (A) 06/30/2016 1441   BILIRUBINUR SMALL (A) 06/30/2016 1441   KETONESUR NEGATIVE 06/30/2016 1441   PROTEINUR >300 (A) 06/30/2016 1441   NITRITE NEGATIVE 06/30/2016 1441   LEUKOCYTESUR NEGATIVE 06/30/2016 1441   Sepsis  Labs: @LABRCNTIP (procalcitonin:4,lacticidven:4)  ) Recent Results (from the past 240 hour(s))  Urine culture     Status: Abnormal   Collection Time: 06/27/16  1:20 PM  Result Value Ref Range Status   Specimen Description URINE, RANDOM  Final   Special Requests NONE  Final   Culture (A)  Final    <10,000 COLONIES/mL INSIGNIFICANT GROWTH Performed at [facility]    Report Status 06/28/2016 FINAL  Final  Blood Culture (routine x 2)     Status: None (Preliminary result)   Collection Time: 06/27/16  3:00 PM  Result Value Ref Range Status   Specimen Description BLOOD LEFT ARM  Final   Special Requests BOTTLES DRAWN AEROBIC AND ANAEROBIC 5CC EACH  Final   Culture   Final    NO GROWTH 2 DAYS Performed at [facility] [place]    Report Status PENDING  Incomplete  Blood Culture (routine x 2)     Status: None (Preliminary result)   Collection Time: 06/27/16  3:20 PM  Result Value Ref Range Status   Specimen Description BLOOD LEFT HAND  Final   Special Requests BOTTLES DRAWN AEROBIC AND ANAEROBIC 5CC EACH  Final   Culture   Final    NO GROWTH 2 DAYS Performed at [facility]    Report Status PENDING  Incomplete  Rapid strep screen     Status: None   Collection Time: 06/27/16  4:15 PM  Result Value Ref Range Status   Streptococcus, Group A Screen (Direct) NEGATIVE NEGATIVE Final    Comment: (NOTE) A Rapid Antigen test may result negative if the antigen level in the sample is below the detection level of this test. The FDA has not cleared this test as a stand-alone test therefore the rapid antigen negative result has reflexed to a Group A Strep culture.   Culture, group A strep     Status: None   Collection Time: 06/27/16  4:15 PM  Result Value Ref Range Status   Specimen Description THROAT  Final  Special Requests NONE Reflexed from [id number]  Final   Culture   Final    NO GROUP A STREP (S.PYOGENES) ISOLATED Performed at [facility]    Report Status  06/29/2016 FINAL  Final      Radiology Studies: No results found.   Scheduled Meds: . benztropine  0.5 mg Oral QHS  . clotrimazole   Topical BID  . enoxaparin (LOVENOX) injection  80 mg Subcutaneous Q24H  . FLUoxetine  40 mg Oral QHS  . fluPHENAZine  15 mg Oral QHS  . folic acid  1 mg Oral Daily  . piperacillin-tazobactam (ZOSYN)  IV  3.375 g Intravenous Q8H  . sodium chloride flush  10-40 mL Intracatheter Q12H  . sodium chloride flush  3 mL Intravenous Q12H  . topiramate  50 mg Oral QHS  . trazodone  300 mg Oral QHS   Continuous Infusions: . 0.9 % NaCl with KCl 20 mEq / L 100 mL/hr at 06/30/16 1458     LOS: 2 days    Time spent: 30 min    Renae Fickle, MD [facility] Pager [phone]  If 7PM-7AM, please contact night-coverage [email or web address] Password [facility] 06/30/2016, 3:19 PM

## 2016-06-30 NOTE — Progress Notes (Signed)
Patient ID: Todd Burton, male   DOB: October 01, 1996, 20 y.o.   MRN: [id number]          [facility]    Date of Admission:  06/27/2016   Total days of antibiotics 4        Day 2 piperacillin tazobactam        Day 2 vancomycin         Principal Problem:   Infectious mononucleosis Active Problems:   SIRS (systemic inflammatory response syndrome) (HCC)   Tachycardia   Hypokalemia   Schizoaffective disorder (HCC)   Tachypnea   . benztropine  0.5 mg Oral QHS  . clotrimazole   Topical BID  . enoxaparin (LOVENOX) injection  80 mg Subcutaneous Q24H  . FLUoxetine  40 mg Oral QHS  . fluPHENAZine  15 mg Oral QHS  . folic acid  1 mg Oral Daily  . piperacillin-tazobactam (ZOSYN)  IV  3.375 g Intravenous Q8H  . sodium chloride flush  10-40 mL Intracatheter Q12H  . sodium chloride flush  3 mL Intravenous Q12H  . topiramate  50 mg Oral QHS  . trazodone  300 mg Oral QHS    SUBJECTIVE: He is feeling a little bit better today. He has not had any more nausea or vomiting. His has some irritation of both eyes over the last few hours. They are itching. He is asking when he can go home.  Review of Systems: Review of Systems  Constitutional: Positive for fever and malaise/fatigue. Negative for diaphoresis and weight loss.  HENT: Negative for sore throat.   Respiratory: Negative for cough, sputum production and shortness of breath.   Cardiovascular: Negative for chest pain.  Gastrointestinal: Negative for abdominal pain, diarrhea, nausea and vomiting.  Genitourinary: Negative for dysuria.  Musculoskeletal: Negative for joint pain and myalgias.  Skin: Negative for rash.  Neurological: Negative for dizziness and headaches.    Past Medical History:  Diagnosis Date  . Obesity   . Schizoaffective disorder [facility])     Social History  Substance Use Topics  . Smoking status: Current Every Day Smoker  . Smokeless tobacco: Never Used  . Alcohol use Yes     Comment:  weekend    Family History  Problem Relation Age of Onset  . Pancreatic cancer Maternal Grandfather   . Pancreatic cancer Paternal Grandmother    No Known Allergies  OBJECTIVE: Vitals:   06/29/16 2200 06/30/16 0007 06/30/16 0613 06/30/16 1436  BP: (!) 126/52 125/77 122/78 122/68  Pulse: (!) 118 (!) 129 (!) 129 (!) 118  Resp: (!) 32 (!) 26 (!) 35 20  Temp: 99.4 F (37.4 C) (!) 101.2 F (38.4 C) 98 F (36.7 C) 98.3 F (36.8 C)  TempSrc: Oral Oral Oral Oral  SpO2: 96% 97% 94% 100%  Weight:      Height:       Body mass index is 47.47 kg/m.  Physical Exam  Constitutional: He is oriented to person, place, and time.  He is alert and in no distress.  HENT:  Mild conjunctival injection bilaterally.  Cardiovascular: Normal rate and regular rhythm.   No murmur heard. Distant heart sounds  Pulmonary/Chest: Breath sounds normal. He has no wheezes. He has no rales.  He has tachypnea can sounds congested.  Abdominal: Soft. There is no tenderness.  Musculoskeletal: Normal range of motion. He exhibits no edema or tenderness.  Neurological: He is alert and oriented to person, place, and time.  Skin: No rash noted.  Psychiatric:  Mood and affect normal.    Lab Results Lab Results  Component Value Date   WBC 19.3 (H) 06/30/2016   HGB 11.1 (L) 06/30/2016   HCT 34.9 (L) 06/30/2016   MCV 96.7 06/30/2016   PLT 199 06/30/2016    Lab Results  Component Value Date   CREATININE 1.43 (H) 06/30/2016   BUN 9 06/30/2016   NA 134 (L) 06/30/2016   K 3.9 06/30/2016   CL 104 06/30/2016   CO2 23 06/30/2016    Lab Results  Component Value Date   ALT 72 (H) 06/28/2016   AST 97 (H) 06/28/2016   ALKPHOS 50 06/28/2016   BILITOT 1.1 06/28/2016     Microbiology: Recent Results (from the past 240 hour(s))  Urine culture     Status: Abnormal   Collection Time: 06/27/16  1:20 PM  Result Value Ref Range Status   Specimen Description URINE, RANDOM  Final   Special Requests NONE  Final    Culture (A)  Final    <10,000 COLONIES/mL INSIGNIFICANT GROWTH Performed at [facility]    Report Status 06/28/2016 FINAL  Final  Blood Culture (routine x 2)     Status: None (Preliminary result)   Collection Time: 06/27/16  3:00 PM  Result Value Ref Range Status   Specimen Description BLOOD LEFT ARM  Final   Special Requests BOTTLES DRAWN AEROBIC AND ANAEROBIC 5CC EACH  Final   Culture   Final    NO GROWTH 2 DAYS Performed at [facility] [place]    Report Status PENDING  Incomplete  Blood Culture (routine x 2)     Status: None (Preliminary result)   Collection Time: 06/27/16  3:20 PM  Result Value Ref Range Status   Specimen Description BLOOD LEFT HAND  Final   Special Requests BOTTLES DRAWN AEROBIC AND ANAEROBIC 5CC EACH  Final   Culture   Final    NO GROWTH 2 DAYS Performed at [facility]    Report Status PENDING  Incomplete  Rapid strep screen     Status: None   Collection Time: 06/27/16  4:15 PM  Result Value Ref Range Status   Streptococcus, Group A Screen (Direct) NEGATIVE NEGATIVE Final    Comment: (NOTE) A Rapid Antigen test may result negative if the antigen level in the sample is below the detection level of this test. The FDA has not cleared this test as a stand-alone test therefore the rapid antigen negative result has reflexed to a Group A Strep culture.   Culture, group A strep     Status: None   Collection Time: 06/27/16  4:15 PM  Result Value Ref Range Status   Specimen Description THROAT  Final   Special Requests NONE Reflexed from [id number]  Final   Culture   Final    NO GROUP A STREP (S.PYOGENES) ISOLATED Performed at [facility]     Report Status 06/29/2016 FINAL  Final     ASSESSMENT: He feels a little bit better today but continues to have high fevers, elevated liver transaminases, monocytosis and lymphocytosis. All of this is compatible with a viral mononucleosis syndrome. He is HIV negative. Epstein-Barr virus panel is  pending. His CMV IgM and IgG antibodies are positive and his CMV is one of the more common causes of protracted fever in otherwise healthy adults. I do not see any evidence of bacterial infection and favor stopping his antibiotics at this time. I have spoken to his mother by phone today. I think  that a lot of his tachypnea is driven by his morbid obesity. Additionally, his fever is probably contributing to his tachycardia and tachypnea but he does not appear toxic or unstable.  PLAN: 1. Discontinue vancomycin and piperacillin tazobactam and observe off of antibiotics 2. Await results of EBV panel 3. Symptomatic therapy  Cliffton Asters, MD [facility] [phone] pager   [phone] cell 06/30/2016, 5:00 PM

## 2016-07-01 LAB — BASIC METABOLIC PANEL
ANION GAP: 5 (ref 5–15)
BUN: 9 mg/dL (ref 6–20)
CO2: 21 mmol/L — ABNORMAL LOW (ref 22–32)
Calcium: 7.4 mg/dL — ABNORMAL LOW (ref 8.9–10.3)
Chloride: 110 mmol/L (ref 101–111)
Creatinine, Ser: 1.38 mg/dL — ABNORMAL HIGH (ref 0.61–1.24)
Glucose, Bld: 81 mg/dL (ref 65–99)
POTASSIUM: 5.4 mmol/L — AB (ref 3.5–5.1)
SODIUM: 136 mmol/L (ref 135–145)

## 2016-07-01 LAB — ROCKY MTN SPOTTED FVR ABS PNL(IGG+IGM)
RMSF IgG: NEGATIVE
RMSF IgM: 0.32 index (ref 0.00–0.89)

## 2016-07-01 LAB — CBC
HEMATOCRIT: 34.4 % — AB (ref 39.0–52.0)
Hemoglobin: 11 g/dL — ABNORMAL LOW (ref 13.0–17.0)
MCH: 31 pg (ref 26.0–34.0)
MCHC: 32 g/dL (ref 30.0–36.0)
MCV: 96.9 fL (ref 78.0–100.0)
Platelets: 162 10*3/uL (ref 150–400)
RBC: 3.55 MIL/uL — AB (ref 4.22–5.81)
RDW: 13.6 % (ref 11.5–15.5)
WBC: 20 10*3/uL — AB (ref 4.0–10.5)

## 2016-07-01 LAB — T-HELPER CELLS (CD4) COUNT (NOT AT ARMC)
CD4 T CELL HELPER: 10 % — AB (ref 33–55)
CD4 T Cell Abs: 1000 /uL (ref 400–2700)

## 2016-07-01 NOTE — Progress Notes (Signed)
PROGRESS NOTE  Todd Burton  ZOX:[id number] DOB: 1996-04-11 DOA: 06/27/2016 PCP: Everrett Coombe, DO  Brief Narrative:   Todd Burton is a 20 y.o. male with schizoaffective disorder who presented to the emergency department with a two-week history of nausea, vomiting, particularly after meals.  He had some cough which would sometimes precede his vomiting.  He denied fever, chills, abdominal pain, diarrhea, dysuria, sore throat, however, in the ER, he was hypotensive, tachypneic, tachycardic and appeared septic.  He was started on antibiotics and shortly thereafter developed a fever to 101.67F.  Monospot was negative despite high suspicion for EBV.  Due to clinical deterioration (lethargy, tachycardia, ongoing fevers, tachypnea, worsening leukocytosis with atypical lymphocytes) ID and ONC were consulted.  Fortunately, after an extensive work up, EBV panel was POSITIVE as was CMV panel.  I suspect he developed EBV infection and has had CMV reactivation.  He is starting to feel better on 07/01/2018.  His WBC appear to be plateauing, but he remains tachypneic and tachycardic.  Would like to see improvement in breathing and heart rate and ideally WBC trending down.  Mother has been at bedside and is very worried about his condition.    Work up: -  Urine culture:  Insignificant growth -  Rapid strep negative -  Monospot negative -  HIV quant RNA negative -  CMV IgM POSITIVE, IgG POSITIVE -  Acute hepatitis panel negative -  RMSF panel negative -  TSH wnl and free t4 only marginally elevated, not the explanation for his symptoms -  Blood cultures NGTD -  Cytology pending -  Case discussed with psychiatry who doubted NMS and serotonin syndrome, but recommended monitoring for clonus and rigidity.  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  Ongoing tachycardia and WBC continues to rise.   -  D/c GI pathogen panel since diarrhea has resolved -  Appreciate ID assistace -  D/c IVF and PO trial today  Sinus tachycardia, out of proportion to his degree of dehydration and fever.   -  Urine drug screen negative -  CT angio negative for PE -  TSH and free T4 near normal (unlikely explanation) -  Troponin negative -  ECHO with preserved EF    Hypokalemia, resolved with supplementation    Schizoaffective disorder (HCC) Continue Cogentin nightly  Continue Prozac 40 mg a day. Continue fluphenazine 15 mg by mouth daily. Continue Topamax 50 mg by mouth at bedtime. Continue trazodone 300 mg by mouth at bedtime. Continue Strattera 10 mg by mouth daily. Continue Risperdal 0.25 mg at bedtime.  OSA, encouraged him to the use the bipap machine which is at bedside  Morbid obesity -  Had some weight loss over last two weeks because not able to eat  Leukocytosis continues to worsen but may be plateauing -  Appreciate oncology assistance, Dr. Myna Hidalgo  Normocytic anemia, possible mild hemolysis given elevated LDH.  No schistocytes seen on smear.  Hemoglobin stable at 11, likely due to viral infection.   -  Iron studies, B12 wnl -  Folate mildly low > started folate supplementation  Elevated creatinine with preserved GFR, may have had some mild rhabdo initially vs. Viral inflammation of kidney plus contrast exposure from CT.   -  Minimize nephrotoxins -  PCP to check as outpatient  DVT prophylaxis:  lovenox Code Status:  full Family Communication:  Patient and mother at bedside Disposition Plan:  To home once tachycardia starts to improve, WBC starting to trend down, proving that he can maintain his hydration/tolerate adequate PO.   Consultants:   Dr. Ninetta LightsHatcher, ID  Dr. Myna HidalgoEnnever,  ONC  Psychiatry by phone  Procedures:  none  Antimicrobials:   Ceftriaxone and flagyl8/25 > 8/26  Doxycycline 8/26  Vancomycin and zosyn 8/27 >    Subjective:  States that he is feeling better today.  Fevers less severe.  Able to eat some yesterday.  No diarrhea in 2 days.  Still having cough and heavy breathing.    Objective: Vitals:   06/30/16 2225 07/01/16 0001 07/01/16 0559 07/01/16 1344  BP: (!) 129/52  (!) 124/42 132/76  Pulse: (!) 121  (!) 119 (!) 118  Resp: 20  (!) 23 17  Temp: (!) 100.4 F (38 C) 98.8 F (37.1 C) 98.3 F (36.8 C) 98.1 F (36.7 C)  TempSrc: Oral Oral Oral Oral  SpO2: 100%  93% 96%  Weight:      Height:        Intake/Output Summary (Last 24 hours) at 07/01/16 1419 Last data filed at 07/01/16 1110  Gross per 24 hour  Intake              472 ml  Output                0 ml  Net              472 ml   Filed Weights   06/27/16 1236 06/27/16 1525 06/27/16 2254  Weight: 127 kg (280 lb) (!) 158.8 kg (350 lb) (!) 158.8 kg (350 lb)    Examination:  General exam:  Adult male, tachypneic HEENT:  NCAT, MMM Respiratory system: Clear to auscultation bilaterally Cardiovascular system: tachycardic, regular rhythm, normal S1/S2. No murmurs, rubs, gallops or clicks.  Warm extremities Gastrointestinal system: Normal active bowel sounds, soft, nondistended, nontender. MSK:  Normal tone and bulk, no lower extremity edema Neuro:  Grossly intact Skin:  No purpura or hives.  No excoriations.      Data Reviewed: I have personally reviewed following labs and imaging studies  CBC:  Recent Labs Lab 06/27/16 1250 06/28/16 0048 06/28/16 0613 06/29/16 0421 06/30/16 0437 07/01/16 0420  WBC 16.1* 13.0* 13.1* 17.2* 19.3* 20.0*  NEUTROABS 7.4 6.3 6.2 7.6  --   --   HGB 12.9* 11.7* 12.2* 11.4* 11.1* 11.0*  HCT 38.4* 35.0* 38.1* 35.8* 34.9* 34.4*  MCV 94.1 94.3 96.9 96.5 96.7 96.9  PLT 233 209 192 214 199 162   Basic Metabolic Panel:  Recent  Labs Lab 06/28/16 0048 06/28/16 0613 06/29/16 0421 06/30/16 0437 07/01/16 0420  NA 132* 135 132* 134* 136  K 3.4* 3.6 3.7 3.9 5.4*  CL 101 102 102 104 110  CO2 24 25 24 23  21*  GLUCOSE 112* 105* 101* 103* 81  BUN 7 7 9 9 9   CREATININE 1.27* 1.23 1.32* 1.43* 1.38*  CALCIUM 7.7* 7.9* 7.9* 7.9* 7.4*  MG 2.0  --   --   --   --   PHOS 2.9  --   --   --   --    GFR: Estimated Creatinine Clearance: 133 mL/min (by C-G formula based on SCr of 1.38 mg/dL). Liver Function Tests:  Recent Labs Lab 06/27/16 1250 06/28/16 0048 06/28/16 [id number]  AST 109* 97* 97*  ALT 72* 68* 72*  ALKPHOS 58 47 50  BILITOT 1.2 1.1 1.1  PROT 7.3 5.9* 5.9*  ALBUMIN 2.9* 2.3* 2.2*    Recent Labs Lab 06/29/16 1410  LIPASE 18  AMYLASE 45   No results for input(s): AMMONIA in the last 168 hours. Coagulation Profile: No results for input(s): INR, PROTIME in the last 168 hours. Cardiac Enzymes:  Recent Labs Lab 06/28/16 1805 06/29/16 1830  CKTOTAL  --  875*  TROPONINI <0.03  --    BNP (last 3 results) No results for input(s): PROBNP in the last 8760 hours. HbA1C:  Recent Labs  06/29/16 1450  HGBA1C 4.8   CBG: No results for input(s): GLUCAP in the last 168 hours. Lipid Profile: No results for input(s): CHOL, HDL, LDLCALC, TRIG, CHOLHDL, LDLDIRECT in the last 72 hours. Thyroid Function Tests:  Recent Labs  06/28/16 1805  TSH 2.131  FREET4 1.15*   Anemia Panel:  Recent Labs  06/28/16 1805  VITAMINB12 496  FOLATE 5.2*  FERRITIN 542*  TIBC 231*  IRON 37*   Urine analysis:    Component Value Date/Time   COLORURINE AMBER (A) 06/30/2016 1441   APPEARANCEUR CLEAR 06/30/2016 1441   LABSPEC 1.016 06/30/2016 1441   PHURINE 6.0 06/30/2016 1441   GLUCOSEU NEGATIVE 06/30/2016 1441   HGBUR MODERATE (A) 06/30/2016 1441   BILIRUBINUR SMALL (A) 06/30/2016 1441   KETONESUR NEGATIVE 06/30/2016 1441   PROTEINUR >300 (A) 06/30/2016 1441   NITRITE NEGATIVE 06/30/2016 1441   LEUKOCYTESUR  NEGATIVE 06/30/2016 1441   Sepsis Labs: @LABRCNTIP (procalcitonin:4,lacticidven:4)  ) Recent Results (from the past 240 hour(s))  Urine culture     Status: Abnormal   Collection Time: 06/27/16  1:20 PM  Result Value Ref Range Status   Specimen Description URINE, RANDOM  Final   Special Requests NONE  Final   Culture (A)  Final    <10,000 COLONIES/mL INSIGNIFICANT GROWTH Performed at [facility]    Report Status 06/28/2016 FINAL  Final  Blood Culture (routine x 2)     Status: None (Preliminary result)   Collection Time: 06/27/16  3:00 PM  Result Value Ref Range Status   Specimen Description BLOOD LEFT ARM  Final   Special Requests BOTTLES DRAWN AEROBIC AND ANAEROBIC 5CC EACH  Final   Culture   Final    NO GROWTH 4 DAYS Performed at [facility]    Report Status PENDING  Incomplete  Blood Culture (routine x 2)     Status: None (Preliminary result)   Collection Time: 06/27/16  3:20 PM  Result Value Ref Range Status   Specimen Description BLOOD LEFT HAND  Final   Special Requests BOTTLES DRAWN AEROBIC AND ANAEROBIC 5CC EACH  Final   Culture   Final    NO GROWTH 4 DAYS Performed at [facility]    Report Status PENDING  Incomplete  Rapid strep screen     Status: None   Collection Time: 06/27/16  4:15 PM  Result Value Ref Range Status   Streptococcus, Group A Screen (Direct) NEGATIVE NEGATIVE Final    Comment: (NOTE) A Rapid Antigen test may result negative if the antigen level in the sample is below the detection level of this test. The FDA has not cleared this test as a stand-alone test therefore the rapid antigen negative result has reflexed to a Group A Strep culture.   Culture, group A strep     Status: None   Collection Time:  06/27/16  4:15 PM  Result Value Ref Range Status   Specimen Description THROAT  Final   Special Requests NONE Reflexed from [id number]  Final   Culture   Final    NO GROUP A STREP (S.PYOGENES) ISOLATED Performed at [facility]    Report Status 06/29/2016 FINAL  Final  Culture, blood (routine x 2)     Status: None (Preliminary result)   Collection Time: 06/29/16 11:20 AM  Result Value Ref Range Status   Specimen Description BLOOD BLOOD RIGHT HAND  Final   Special Requests IN PEDIATRIC BOTTLE 2CC  Final   Culture NO GROWTH 2 DAYS  Final   Report Status PENDING  Incomplete  Culture, blood (routine x 2)     Status: None (Preliminary result)   Collection Time: 06/29/16 11:35 AM  Result Value Ref Range Status   Specimen Description BLOOD BLOOD RIGHT HAND  Final   Special Requests IN PEDIATRIC BOTTLE 2CC  Final   Culture NO GROWTH 2 DAYS  Final   Report Status PENDING  Incomplete      Radiology Studies: No results found.   Scheduled Meds: . benztropine  0.5 mg Oral QHS  . clotrimazole   Topical BID  . enoxaparin (LOVENOX) injection  80 mg Subcutaneous Q24H  . FLUoxetine  40 mg Oral QHS  . fluPHENAZine  15 mg Oral QHS  . folic acid  1 mg Oral Daily  . sodium chloride flush  10-40 mL Intracatheter Q12H  . sodium chloride flush  3 mL Intravenous Q12H  . topiramate  50 mg Oral QHS  . trazodone  300 mg Oral QHS   Continuous Infusions:     LOS: 3 days    Time spent: 30 min    Renae Fickle, MD [facility] Pager [phone]  If 7PM-7AM, please contact night-coverage [email or web address] Password [email or web address] 07/01/2016, 2:19 PM

## 2016-07-01 NOTE — Progress Notes (Signed)
Patient ID: Todd Burton, male   DOB: Oct 14, 1996, 20 y.o.   MRN: [id number]          [facility]    Date of Admission:  06/27/2016     Principal Problem:   Infectious mononucleosis Active Problems:   SIRS (systemic inflammatory response syndrome) (HCC)   Tachycardia   Hypokalemia   Schizoaffective disorder (HCC)   Tachypnea   . benztropine  0.5 mg Oral QHS  . clotrimazole   Topical BID  . enoxaparin (LOVENOX) injection  80 mg Subcutaneous Q24H  . FLUoxetine  40 mg Oral QHS  . fluPHENAZine  15 mg Oral QHS  . folic acid  1 mg Oral Daily  . sodium chloride flush  10-40 mL Intracatheter Q12H  . sodium chloride flush  3 mL Intravenous Q12H  . topiramate  50 mg Oral QHS  . trazodone  300 mg Oral QHS    SUBJECTIVE: He is feeling much better. He is not having any more nausea or vomiting. He is eating and drinking more and notes that his urine is now looking more like normal. He is hoping to go home tomorrow.  Review of Systems: Review of Systems  Constitutional: Positive for fever. Negative for chills, diaphoresis, malaise/fatigue and weight loss.  HENT: Negative for sore throat.   Respiratory: Negative for cough, sputum production and shortness of breath.   Cardiovascular: Negative for chest pain.  Gastrointestinal: Negative for abdominal pain, diarrhea, nausea and vomiting.  Genitourinary: Negative for dysuria and frequency.  Musculoskeletal: Negative for joint pain and myalgias.  Skin: Negative for rash.  Neurological: Negative for headaches.    Past Medical History:  Diagnosis Date  . Obesity   . Schizoaffective disorder [facility])     Social History  Substance Use Topics  . Smoking status: Current Every Day Smoker  . Smokeless tobacco: Never Used  . Alcohol use Yes     Comment: weekend    Family History  Problem Relation Age of Onset  . Pancreatic cancer Maternal Grandfather   . Pancreatic cancer Paternal Grandmother    No Known  Allergies  OBJECTIVE: Vitals:   06/30/16 2225 07/01/16 0001 07/01/16 0559 07/01/16 1344  BP: (!) 129/52  (!) 124/42 132/76  Pulse: (!) 121  (!) 119 (!) 118  Resp: 20  (!) 23 17  Temp: (!) 100.4 F (38 C) 98.8 F (37.1 C) 98.3 F (36.8 C) 98.1 F (36.7 C)  TempSrc: Oral Oral Oral Oral  SpO2: 100%  93% 96%  Weight:      Height:       Body mass index is 47.47 kg/m.  Physical Exam  Constitutional: He is oriented to person, place, and time.  He is pleasant and in no distress. He is listening to music on his phone.  HENT:  Mouth/Throat: No oropharyngeal exudate.  Eyes: Conjunctivae are normal.  Cardiovascular: Normal rate and regular rhythm.   No murmur heard. Pulmonary/Chest: Effort normal and breath sounds normal.  Abdominal: Soft. He exhibits no mass. There is no tenderness.  Musculoskeletal: Normal range of motion. He exhibits no edema or tenderness.  Neurological: He is alert and oriented to person, place, and time.  Skin: No rash noted.  Psychiatric: Mood and affect normal.    Lab Results Lab Results  Component Value Date   WBC 20.0 (H) 07/01/2016   HGB 11.0 (L) 07/01/2016   HCT 34.4 (L) 07/01/2016   MCV 96.9 07/01/2016   PLT 162 07/01/2016  Lab Results  Component Value Date   CREATININE 1.38 (H) 07/01/2016   BUN 9 07/01/2016   NA 136 07/01/2016   K 5.4 (H) 07/01/2016   CL 110 07/01/2016   CO2 21 (L) 07/01/2016    Lab Results  Component Value Date   ALT 72 (H) 06/28/2016   AST 97 (H) 06/28/2016   ALKPHOS 50 06/28/2016   BILITOT 1.1 06/28/2016     Microbiology: Recent Results (from the past 240 hour(s))  Urine culture     Status: Abnormal   Collection Time: 06/27/16  1:20 PM  Result Value Ref Range Status   Specimen Description URINE, RANDOM  Final   Special Requests NONE  Final   Culture (A)  Final    <10,000 COLONIES/mL INSIGNIFICANT GROWTH [facility]formed [facility]    Report Status 06/28/2016 FINAL  Final  Blood Culture (routine x  2)     Status: None (Preliminary result)   Collection Time: 06/27/16  3:00 PM  Result Value Ref Range Status   Specimen Description BLOOD LEFT ARM  Final   Special Requests BOTTLES DRAWN AEROBIC AND ANAEROBIC 5CC EACH  Final   Culture   Final    NO GROWTH 4 DAYS [facility]    Report Status PENDING  Incomplete  Blood Culture (routine x 2)     Status: None (Preliminary result)   Collection Time: 06/27/16  3:20 PM  Result Value Ref Range Status   Specimen Description BLOOD LEFT HAND  Final   Special Requests BOTTLES DRAWN AEROBIC AND ANAEROBIC 5CC EACH  Final   Culture   Final    NO GROWTH 4 DAYS [facility]    Report Status PENDING  Incomplete  Rapid strep screen     Status: None   Collection Time: 06/27/16  4:15 PM  Result Value Ref Range Status   Streptococcus, Group A Screen (Direct) NEGATIVE NEGATIVE Final    Comment: (NOTE) A Rapid Antigen test may result negative if the antigen level in the sample is below the detection level of this test. The FDA has not cleared this test as a stand-alone test therefore the rapid antigen negative result has reflexed to a Group A Strep culture.   Culture, group A strep     Status: None   Collection Time: 06/27/16  4:15 PM  Result Value Ref Range Status   Specimen Description THROAT  Final   Special Requests NONE Reflexed from [id number]  Final   Culture   Final    NO GROUP A STREP (S.PYOGENES) ISOLATED Performed at [facility]     Report Status 06/29/2016 FINAL  Final  Culture, blood (routine x 2)     Status: None (Preliminary result)   Collection Time: 06/29/16 11:20 AM  Result Value Ref Range Status   Specimen Description BLOOD BLOOD RIGHT HAND  Final   Special Requests IN PEDIATRIC BOTTLE 2CC  Final   Culture NO GROWTH 2 DAYS  Final   Report Status PENDING  Incomplete  Culture, blood (routine x 2)     Status: None (Preliminary result)   Collection Time: 06/29/16 11:35 AM  Result  Value Ref Range Status   Specimen Description BLOOD BLOOD RIGHT HAND  Final   Special Requests IN PEDIATRIC BOTTLE 2CC  Final   Culture NO GROWTH 2 DAYS  Final   Report Status PENDING  Incomplete     ASSESSMENT: He has acute mononucleosis. His EBV and CMV serologies are  positive. It is not entirely clear which is the culprit but it does not really matter. He is improving and does not require any further diagnostic evaluation or specific treatment. I am comfortable with discharge at any time.  PLAN: 1. Continue symptomatic therapy and discharge soon 2. I will sign off now but please call if I can be of further assistance while he is here  Cliffton Asters, MD [facility] [phone] pager   [phone] cell 07/01/2016, 4:00 PM

## 2016-07-01 NOTE — Care Management Note (Signed)
Case Management Note  Patient Details  Name: Roanna BanningColby Harter MRN: [id number] Date of Birth: 08/19/1996  Subjective/Objective:                 20 year old male with mono, elevated liver enzymes, continues fevers, ID consult DC'd Abx today. Patient lives at home with parents, payor is medicaid, is followed by PCP.    Action/Plan:  No CM needs at this time.  Expected Discharge Date:                  Expected Discharge Plan:  Home/Self Care  In-House Referral:  NA  Discharge planning Services  CM Consult  Post Acute Care Choice:  NA Choice offered to:  NA  DME Arranged:  N/A DME Agency:  NA  HH Arranged:  NA HH Agency:  NA  Status of Service:  Completed, signed off  If discussed at Long Length of Stay Meetings, dates discussed:    Additional Comments:  Lawerance SabalDebbie Desiree Fleming, RN 07/01/2016, 11:08 AM

## 2016-07-02 ENCOUNTER — Encounter (HOSPITAL_COMMUNITY): Payer: Self-pay | Admitting: Family Medicine

## 2016-07-02 DIAGNOSIS — R000 Tachycardia, unspecified: Secondary | ICD-10-CM

## 2016-07-02 DIAGNOSIS — E876 Hypokalemia: Secondary | ICD-10-CM

## 2016-07-02 DIAGNOSIS — R809 Proteinuria, unspecified: Secondary | ICD-10-CM

## 2016-07-02 DIAGNOSIS — F259 Schizoaffective disorder, unspecified: Secondary | ICD-10-CM

## 2016-07-02 DIAGNOSIS — R0682 Tachypnea, not elsewhere classified: Secondary | ICD-10-CM

## 2016-07-02 DIAGNOSIS — R6510 Systemic inflammatory response syndrome (SIRS) of non-infectious origin without acute organ dysfunction: Secondary | ICD-10-CM

## 2016-07-02 LAB — CULTURE, BLOOD (ROUTINE X 2)
CULTURE: NO GROWTH
Culture: NO GROWTH

## 2016-07-02 LAB — CBC
HEMATOCRIT: 34.9 % — AB (ref 39.0–52.0)
HEMOGLOBIN: 11.2 g/dL — AB (ref 13.0–17.0)
MCH: 31.2 pg (ref 26.0–34.0)
MCHC: 32.1 g/dL (ref 30.0–36.0)
MCV: 97.2 fL (ref 78.0–100.0)
Platelets: 212 10*3/uL (ref 150–400)
RBC: 3.59 MIL/uL — ABNORMAL LOW (ref 4.22–5.81)
RDW: 13.7 % (ref 11.5–15.5)
WBC: 21.2 10*3/uL — ABNORMAL HIGH (ref 4.0–10.5)

## 2016-07-02 LAB — BASIC METABOLIC PANEL
ANION GAP: 8 (ref 5–15)
BUN: 11 mg/dL (ref 6–20)
CO2: 22 mmol/L (ref 22–32)
Calcium: 8.1 mg/dL — ABNORMAL LOW (ref 8.9–10.3)
Chloride: 106 mmol/L (ref 101–111)
Creatinine, Ser: 1.45 mg/dL — ABNORMAL HIGH (ref 0.61–1.24)
GFR calc Af Amer: >60 mL/min (ref >60)
GLUCOSE: 87 mg/dL (ref 65–99)
POTASSIUM: 4.4 mmol/L (ref 3.5–5.1)
Sodium: 136 mmol/L (ref 135–145)

## 2016-07-02 MED ORDER — FOLIC ACID 1 MG PO TABS: 1.0000 mg | ORAL_TABLET | Freq: Every day | ORAL | 0 refills | Status: AC

## 2016-07-02 MED ORDER — CLOTRIMAZOLE 1 % EX CREA: TOPICAL_CREAM | Freq: Two times a day (BID) | CUTANEOUS | 0 refills | Status: AC

## 2016-07-02 NOTE — Discharge Instructions (Signed)
Heart-Healthy Eating Plan °Heart-healthy meal planning includes: °· Limiting unhealthy fats. °· Increasing healthy fats. °· Making other small dietary changes. °You may need to talk with your doctor or a diet specialist (dietitian) to create an eating plan that is right for you. °WHAT TYPES OF FAT SHOULD I CHOOSE? °· Choose healthy fats. These include olive oil and canola oil, flaxseeds, walnuts, almonds, and seeds. °· Eat more omega-3 fats. These include salmon, mackerel, sardines, tuna, flaxseed oil, and ground flaxseeds. Try to eat fish at least twice each week. °· Limit saturated fats. °¨ Saturated fats are often found in animal products, such as meats, butter, and cream. °¨ Plant sources of saturated fats include palm oil, palm kernel oil, and coconut oil. °· Avoid foods with partially hydrogenated oils in them. These include stick margarine, some tub margarines, cookies, crackers, and other baked goods. These contain trans fats. °WHAT GENERAL GUIDELINES DO I NEED TO FOLLOW? °· Check food labels carefully. Identify foods with trans fats or high amounts of saturated fat. °· Fill one half of your plate with vegetables and green salads. Eat 4-5 servings of vegetables per day. A serving of vegetables is: °¨ 1 cup of raw leafy vegetables. °¨ ½ cup of raw or cooked cut-up vegetables. °¨ ½ cup of vegetable juice. °· Fill one fourth of your plate with whole grains. Look for the word "whole" as the first word in the ingredient list. °· Fill one fourth of your plate with lean protein foods. °· Eat 4-5 servings of fruit per day. A serving of fruit is: °¨ One medium whole fruit. °¨ ¼ cup of dried fruit. °¨ ½ cup of fresh, frozen, or canned fruit. °¨ ½ cup of 100% fruit juice. °· Eat more foods that contain soluble fiber. These include apples, broccoli, carrots, beans, peas, and barley. Try to get 20-30 g of fiber per day. °· Eat more home-cooked food. Eat less restaurant, buffet, and fast food. °· Limit or avoid  alcohol. °· Limit foods high in starch and sugar. °· Avoid fried foods. °· Avoid frying your food. Try baking, boiling, grilling, or broiling it instead. You can also reduce fat by: °¨ Removing the skin from poultry. °¨ Removing all visible fats from meats. °¨ Skimming the fat off of stews, soups, and gravies before serving them. °¨ Steaming vegetables in water or broth. °· Lose weight if you are overweight. °· Eat 4-5 servings of nuts, legumes, and seeds per week: °¨ One serving of dried beans or legumes equals ½ cup after being cooked. °¨ One serving of nuts equals 1½ ounces. °¨ One serving of seeds equals ½ ounce or one tablespoon. °· You may need to keep track of how much salt or sodium you eat. This is especially true if you have high blood pressure. Talk with your doctor or dietitian to get more information. °WHAT FOODS CAN I EAT? °Grains °Breads, including French, white, pita, wheat, raisin, rye, oatmeal, and Italian. Tortillas that are neither fried nor made with lard or trans fat. Low-fat rolls, including hotdog and hamburger buns and English muffins. Biscuits. Muffins. Waffles. Pancakes. Light popcorn. Whole-grain cereals. Flatbread. Melba toast. Pretzels. Breadsticks. Rusks. Low-fat snacks. Low-fat crackers, including oyster, saltine, matzo, graham, animal, and rye. Rice and pasta, including brown rice and pastas that are made with whole wheat.  °Vegetables °All vegetables.  °Fruits °All fruits, but limit coconut. °Meats and Other Protein Sources °Lean, well-trimmed beef, veal, pork, and lamb. Chicken and turkey without skin. All fish and   shellfish. Wild duck, rabbit, pheasant, and venison. Egg whites or low-cholesterol egg substitutes. Dried beans, peas, lentils, and tofu. Seeds and most nuts. Dairy Low-fat or nonfat cheeses, including ricotta, string, and mozzarella. Skim or 1% milk that is liquid, powdered, or evaporated. Buttermilk that is made with low-fat milk. Nonfat or low-fat  yogurt. Beverages Mineral water. Diet carbonated beverages. Sweets and Desserts Sherbets and fruit ices. Honey, jam, marmalade, jelly, and syrups. Meringues and gelatins. Pure sugar candy, such as hard candy, jelly beans, gumdrops, mints, marshmallows, and small amounts of dark chocolate. [employer]. Eat all sweets and desserts in moderation. Fats and Oils Nonhydrogenated (trans-free) margarines. Vegetable oils, including soybean, sesame, sunflower, olive, peanut, safflower, corn, canola, and cottonseed. Salad dressings or mayonnaise made with a vegetable oil. Limit added fats and oils that you use for cooking, baking, salads, and as spreads. Other Cocoa powder. Coffee and tea. All seasonings and condiments. The items listed above may not be a complete list of recommended foods or beverages. Contact your dietitian for more options. WHAT FOODS ARE NOT RECOMMENDED? Grains Breads that are made with saturated or trans fats, oils, or whole milk. Croissants. Butter rolls. Cheese breads. Sweet rolls. Donuts. Buttered popcorn. Chow mein noodles. High-fat crackers, such as cheese or butter crackers. Meats and Other Protein Sources Fatty meats, such as hotdogs, short ribs, sausage, spareribs, bacon, rib eye roast or steak, and mutton. High-fat deli meats, such as salami and bologna. Caviar. Domestic duck and goose. Organ meats, such as kidney, liver, sweetbreads, and heart. Dairy Cream, sour cream, cream cheese, and creamed cottage cheese. Whole-milk cheeses, including blue (bleu), [address], [place], [place], [address], [place], [address], cheddar, [place], and [place]. Whole or 2% milk that is liquid, evaporated, or condensed. Whole buttermilk. Cream sauce or high-fat cheese sauce. Yogurt that is made from whole milk. Beverages Regular sodas and juice drinks with added sugar. Sweets and Desserts Frosting. Pudding. Cookies. Cakes other than angel food cake. Candy that has milk chocolate or white  chocolate, hydrogenated fat, butter, coconut, or unknown ingredients. Buttered syrups. Full-fat ice cream or ice cream drinks. Fats and Oils Gravy that has suet, meat fat, or shortening. Cocoa butter, hydrogenated oils, palm oil, coconut oil, palm kernel oil. These can often be found in baked products, candy, fried foods, nondairy creamers, and whipped toppings. Solid fats and shortenings, including bacon fat, salt pork, lard, and butter. Nondairy cream substitutes, such as coffee creamers and sour cream substitutes. Salad dressings that are made of unknown oils, cheese, or sour cream. The items listed above may not be a complete list of foods and beverages to avoid. Contact your dietitian for more information.   This information is not intended to replace advice given to you by your health care provider. Make sure you discuss any questions you have with your health care provider.   Document Released: 04/20/2012 Document Revised: 11/10/2014 Document Reviewed: 04/13/2014 Elsevier Interactive Patient Education 2016 [employer] Inc[employer]   Infectious Mononucleosis Infectious mononucleosis is an infection caused by a virus. This illness is often called "mono." You can get mono from close contact with someone who is infected. If you have mono, you may feel tired and have a sore throat, a headache, or a fever. HOME CARE  Rest as needed.  Do not play contact sports, perform hard exercise, or lift anything that is heavy until your doctor says you can. You may need to wait a few months before playing sports. Your liver or spleen might be swollen and could  get hurt.  Drink enough fluid to keep your pee (urine) clear or pale yellow.  Do not drink alcohol.  Take medicines only as told by your doctor. Do not give aspirin to children.  Eat soft foods. Cold foods such as ice cream or frozen ice pops can make your throat feel better.  If you have a sore throat, rinse your mouth (gargle) with a mixture of salt  and water. Mix 1 teaspoon of salt with 1 cup of warm water.  Sucking on hard candy can help a sore throat.  Avoid kissing or sharing your drinking glass until your doctor says you are better. GET HELP IF:  Your fever does not go away after 10 days.  Your swollen glands are not back to normal after 4 weeks.  Your activity level is not back to normal after 2 months.  You have a yellow color to your eyes and skin (jaundice).  You have trouble pooping (constipation). GET HELP RIGHT AWAY IF:   You have strong pain in your belly or shoulder.  You are drooling.  You have trouble swallowing.  You have trouble breathing.  You have a stiff neck.  You have a bad headache.  You keep throwing up (vomiting).  You have twitching or shaking (convulsions).  You are confused.  You have trouble with balance.  You have signs of body fluid loss (dehydration):  Weakness.  Sunken eyes.  Pale skin.  Dry mouth.  Fast breathing or heartbeat.   This information is not intended to replace advice given to you by your health care provider. Make sure you discuss any questions you have with your health care provider.   Document Released: 10/08/2009 Document Revised: 11/10/2014 Document Reviewed: 06/27/2014 Elsevier Interactive Patient Education 2016 [employer] Inc[employer]   Nonspecific Tachycardia Tachycardia is a faster than normal heartbeat (more than 100 beats per minute). In adults, the heart normally beats between 60 and 100 times a minute. A fast heartbeat may be a normal response to exercise or stress. It does not necessarily mean that something is wrong. However, sometimes when your heart beats too fast it may not be able to pump enough blood to the rest of your body. This can result in chest pain, shortness of breath, dizziness, and even fainting. Nonspecific tachycardia means that the specific cause or pattern of your tachycardia is unknown. CAUSES  Tachycardia may be harmless or it may  be due to a more serious underlying cause. Possible causes of tachycardia include:  Exercise or exertion.  Fever.  Pain or injury.  Infection.  Loss of body fluids (dehydration).  Overactive thyroid.  Lack of red blood cells (anemia).  Anxiety and stress.  Alcohol.  Caffeine.  Tobacco products.  Diet pills.  Illegal drugs.  Heart disease. SYMPTOMS  Rapid or irregular heartbeat (palpitations).  Suddenly feeling your heart beating (cardiac awareness).  Dizziness.  Tiredness (fatigue).  Shortness of breath.  Chest pain.  Nausea.  Fainting. DIAGNOSIS  Your caregiver will perform a physical exam and take your medical history. In some cases, a heart specialist (cardiologist) may be consulted. Your caregiver may also order:  Blood tests.  Electrocardiography. This test records the electrical activity of your heart.  A heart monitoring test. TREATMENT  Treatment will depend on the likely cause of your tachycardia. The goal is to treat the underlying cause of your tachycardia. Treatment methods may include:  Replacement of fluids or blood through an intravenous (IV) tube for moderate to severe dehydration or anemia.  New medicines or changes in your current medicines.  Diet and lifestyle changes.  Treatment for certain infections.  Stress relief or relaxation methods. HOME CARE INSTRUCTIONS   Rest.  Drink enough fluids to keep your urine clear or pale yellow.  Do not smoke.  Avoid:  Caffeine.  Tobacco.  Alcohol.  Chocolate.  Stimulants such as over-the-counter diet pills or pills that help you stay awake.  Situations that cause anxiety or stress.  Illegal drugs such as marijuana, phencyclidine (PCP), and cocaine.  Only take medicine as directed by your caregiver.  Keep all follow-up appointments as directed by your caregiver. SEEK IMMEDIATE MEDICAL CARE IF:   You have pain in your chest, upper arms, jaw, or neck.  You become  weak, dizzy, or feel faint.  You have palpitations that will not go away.  You vomit, have diarrhea, or pass blood in your stool.  Your skin is cool, pale, and wet.  You have a fever that will not go away with rest, fluids, and medicine. MAKE SURE YOU:   Understand these instructions.  Will watch your condition.  Will get help right away if you are not doing well or get worse.   This information is not intended to replace advice given to you by your health care provider. Make sure you discuss any questions you have with your health care provider.   Document Released: 11/27/2004 Document Revised: 01/12/2012 Document Reviewed: 05/04/2015 Elsevier Interactive Patient Education 2016 Else[employer] Inc.   White Blood Cell Count Test Your white blood cells (leukocytes) are the cells in your blood that help you fight infection. They are part of your body's defense system (immune system). When the immune system needs more white blood cells, the tissue inside your bones (marrow) makes more and releases them into your blood. They travel through your bloodstream to areas of the body where they are needed. A white blood cell count measures how many white blood cells you have. Different types of white blood cells make up your total count. These include:  Lymphocytes.  Neutrophils.  Basophils.  Monocytes.  Eosinophils. Your white blood cell count includes a breakdown of how many of each of these cells you have. You may have this test as part of a routine checkup that includes a complete blood count to measure all the cells in your blood. You may also have this test if you have any signs or symptoms of:  Illness.  Infection.  An allergic reaction.  Inflammation, which is swelling and irritation anywhere in your body. Your white blood cells play a major role in the process of inflammation. This test requires a blood sample taken from a vein in your hand or arm. PREPARATION FOR TEST Let  your health care provider know if:  You have any medical conditions. Some conditions can affect your white blood cell count.  You are taking any medicines. This includes over-the-counter medicines, vitamins, and supplements. Some medicines can cause your count to go up or down.  You are pregnant.  You have had your spleen removed.  You have had any recent physical or emotional stress. RESULTS It is your responsibility to obtain your test results. Ask the lab or department performing the test when and how you will get your results. Contact your health care provider to discuss any questions you have about your results.  The results of this test will be a numerical value for each type of white blood cell and a total count of all the white  blood cells in your sample. Total white blood cell count is measured in number of cells per cubic millimeter. Range of Normal Values Ranges for normal values may vary among different labs and hospitals. You should always check with your health care provider after having lab work or other tests done to discuss whether your values are considered within normal limits. The normal ranges for total white blood cell count are:  Adults and children over 452 years of age: 39,000-10,000 per cubic millimeter.  Children age 61 years or younger: 6,200-17,000 per cubic millimeter.  Newborn: 9,000-30,000 per cubic millimeter. Meaning of Results Outside Normal Value Ranges A low white blood cell count is called leukopenia. Common causes include:  Severe bacterial infections.  Some viral infections.  Bone marrow disease.  Immune system disease.  Cancer or cancer treatments.  Medicines. A high white blood cell count is called leukocytosis. Common causes include:  Bacterial and other infections.  White blood cell cancers.  Trauma.  Allergic reactions.  Diseases that cause inflammation.  Stress.  Medicines. Many factors can cause your white blood cell count  to be outside the normal range. Discuss your results with your health care provider. You may need to have additional tests.   This information is not intended to replace advice given to you by your health care provider. Make sure you discuss any questions you have with your health care provider.   Document Released: 11/14/2004 Document Revised: 11/10/2014 Document Reviewed: 02/08/2014 Elsevier Interactive Patient Education 2016 Else[employer] Inc[employer] Leukocytosis Leukocytosis means you have more white blood cells than normal. White blood cells are made in your bone marrow. The main job of white blood cells is to fight infection. Having too many white blood cells is a common condition. It can develop as a result of many types of medical problems. CAUSES  In some cases, your bone marrow may be normal, but it is still making too many white blood cells. This could be the result of:  Infection.  Injury.  Physical stress.  Emotional stress.  Surgery.  Allergic reactions.  Tumors that do not start in the blood or bone marrow.  An inherited disease.  Certain medicines.  Pregnancy and labor. In other cases, you may have a bone marrow disorder that is causing your body to make too many white blood cells. Bone marrow disorders include:  Leukemia. This is a type of blood cancer.  Myeloproliferative disorders. These disorders cause blood cells to grow abnormally. SYMPTOMS  Some people have no symptoms. Others have symptoms due to the medical problem that is causing their leukocytosis. These symptoms may include:  Bleeding.  Bruising.  Fever.  Night sweats.  Repeated infections.  Weakness.  Weight loss. DIAGNOSIS  Leukocytosis is often found during blood tests that are done as part of a normal physical exam. Your caregiver will probably order other tests to help determine why you have too many white blood cells. These tests may include:  A complete blood count (CBC). This test  measures all the types of blood cells in your body.  Chest X-rays, urine tests (urinalysis), or other tests to look for signs of infection.  Bone marrow aspiration. For this test, a needle is put into your bone. Cells from the bone marrow are removed through the needle. The cells are then examined under a microscope. TREATMENT  Treatment is usually not needed for leukocytosis. However, if a disorder is causing your leukocytosis, it will need to be treated. Treatment may include:  Antibiotic medicines  if you have a bacterial infection.  Bone marrow transplant. Your diseased bone marrow is replaced with healthy cells that will grow new bone marrow.  Chemotherapy. This is the use of drugs to kill cancer cells. HOME CARE INSTRUCTIONS  Only take over-the-counter or prescription medicines as directed by your caregiver.  Maintain a healthy weight. Ask your caregiver what weight is best for you.  Eat foods that are low in saturated fats and high in fiber. Eat plenty of fruits and vegetables.  Drink enough fluids to keep your urine clear or pale yellow.  Get 30 minutes of exercise at least 5 times a week. Check with your caregiver before starting a new exercise routine.  Limit caffeine and alcohol.  Do not smoke.  Keep all follow-up appointments as directed by your caregiver. SEEK MEDICAL CARE IF:  You feel weak or more tired than usual.  You develop chills, a cough, or nasal congestion.  You lose weight without trying.  You have night sweats.  You bruise easily. SEEK IMMEDIATE MEDICAL CARE IF:  You bleed more than normal.  You have chest pain.  You have trouble breathing.  You have a fever.  You have uncontrolled nausea or vomiting.  You feel dizzy or lightheaded. MAKE SURE YOU:  Understand these instructions.  Will watch your condition.  Will get help right away if you are not doing well or get worse.   This information is not intended to replace advice given to  you by your health care provider. Make sure you discuss any questions you have with your health care provider.   Document Released: 10/09/2011 Document Revised: 01/12/2012 Document Reviewed: 04/23/2015 Elsevier Interactive Patient Education 2016 [employer].   Obesity Obesity is having too much body fat and a body mass index (BMI) of 30 or more. BMI is a number that is based on your height and weight. BMI is usually figured out by your doctor during regular wellness visits. The number is an estimate of how much body fat you have. Obesity can happen if you eat more calories than you can burn with exercise or other activity. It can cause major health problems or emergencies.  HOME CARE  Exercise and be active as told by your doctor. Try:  Using stairs when you can.  Parking farther away from store doors.  Gardening, biking, or walking.  Eat healthy foods and drinks that are low in calories. Eat more fruits and vegetables.  Limit fast food, sweets, and snack foods that are made with ingredients that are not natural (processed food).  Eat smaller amounts of food.  Keep a journal and write down what you eat every day. Websites can help with this.  Avoid drinking alcohol. Drink more water and drinks that have no calories.  Take vitamins and dietary pills (supplements) only as told by your doctor.  Try going to weight-loss support groups or classes. These can help to lessen stress. Dietitians and counselors may also help. GET HELP RIGHT AWAY IF:  You have pain or tightness in your chest.  You have trouble breathing or feel short of breath.  You feel weak or have loss of feeling (numbness) in your legs.  You feel confused or have trouble talking.  You have sudden changes in your vision.   This information is not intended to replace advice given to you by your health care provider. Make sure you discuss any questions you have with your health care provider.   Document Released:  01/12/2012 Document  Revised: 11/10/2014 Document Reviewed: 01/12/2012 Elsevier Interactive Patient Education 2016 Else[employer] Inc.   Sleep Apnea Sleep apnea is disorder that affects a person's sleep. A person with sleep apnea has abnormal pauses in their breathing when they sleep. It is hard for them to get a good sleep. This makes a person tired during the day. It also can lead to other physical problems. There are three types of sleep apnea. One type is when breathing stops for a short time because your airway is blocked (obstructive sleep apnea). Another type is when the brain sometimes fails to give the normal signal to breathe to the muscles that control your breathing (central sleep apnea). The third type is a combination of the other two types. HOME CARE  Do not sleep on your back. Try to sleep on your side.  Take all medicine as told by your doctor.  Avoid alcohol, calming medicines (sedatives), and depressant drugs.  Try to lose weight if you are overweight. Talk to your doctor about a healthy weight goal. Your doctor may have you use a device that helps to open your airway. It can help you get the air that you need. It is called a positive airway pressure (PAP) device. There are three types of PAP devices:  Continuous positive airway pressure (CPAP) device.  Nasal expiratory positive airway pressure (EPAP) device.  Bilevel positive airway pressure (BPAP) device. MAKE SURE YOU:  Understand these instructions.  Will watch your condition.  Will get help right away if you are not doing well or get worse.   This information is not intended to replace advice given to you by your health care provider. Make sure you discuss any questions you have with your health care provider.   Document Released: 07/29/2008 Document Revised: 11/10/2014 Document Reviewed: 02/21/2012 Elsevier Interactive Patient Education [employer]

## 2016-07-02 NOTE — Discharge Summary (Signed)
Physician Discharge Summary  Todd Burton WUJ:[id number] DOB: January 30, 1996 DOA: 06/27/2016  PCP: Todd Coombe, DO  Admit date: 06/27/2016 Discharge date: 07/02/2016  Admitted From: Home Disposition:  Home  Recommendations for Outpatient Follow-up:  1. Follow up with PCP in 1 weeks 2. [facility] 3. Follow up with Dr. Myna Hidalgo hematology in 1 week 4. Please consider outpatient referral to nephrology to evaluate proteinuria, CKD 5. Please obtain BMP/CBC in one week  Discharge Condition: Stable  CODE STATUS: FULL Diet recommendation: Heart Healthy / Carb Modified  Brief/Interim Summary: Todd Burton a 20 y.o.malewith schizoaffective disorder who presented to the emergency department with a two-week history of nausea, vomiting, particularly after meals.  He has some cough which would sometimes precede his vomiting.  He denies fever, chills, abdominal pain, diarrhea, constipation, melena or hematochezia, dysuria, frequency, hematuria, sore throat, cough, chest pain, dyspnea, palpitations, dizziness, diaphoresis, pitting edema of the lower extremities, PND or orthopnea. In the ER, he was hypotensive, tachypneic, tachycardic. Blood pressure normalized after the patient received 3 L of normal saline, but the tachycardia, although better, has persisted.  He developed a fever to 101.46F shortly after admission.  Labs were concerning for leukocytosis with lymphocytosis with atypical lymphocytes.  Duration of his symptoms is unusual for typical viral infection.  He has also been persistently tachycardic out of proportion to his fever and the amount of IVF he has received.  Monospot negative.    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    -  D/c GI pathogen panel since diarrhea has resolved -  Appreciate ID assistace. OK with patient being discharged.  No further treatment recommendations. -  D/c  IVF and tolerated PO trial  Sinus tachycardia -  Urine drug screen negative -  CT angio negative for PE -  TSH and free T4 near normal -  Troponin negative -  ECHO with preserved EF, small pericardial effusion  Hypokalemia, resolved with supplementation  Schizoaffective disorder (HCC) Continue Cogentin nightly  Continue Prozac 40 mg a day. Continue fluphenazine 15 mg by mouth daily. Continue Topamax 50 mg by mouth at bedtime. Continue trazodone 300 mg by mouth at bedtime. Continue Strattera 10 mg by mouth daily. Continue Risperdal 0.25 mg at bedtime.  OSA, encouraged him to the use the bipap machine which is at bedside  Morbid obesity -  Had some weight loss over last two weeks because not able to eat, follow up with outpatient dietitian.   Leukocytosis -  Appreciate oncology assistance, Dr. Myna Hidalgo, follow up with Dr. Myna Hidalgo outpatient.   Normocytic anemia, possible mild hemolysis given elevated LDH.  No schistocytes seen on smear.  Hemoglobin stable at 11, likely due to viral infection.   -  Iron studies, B12 wnl -  Folate mildly low > started folate supplementation  Elevated creatinine with preserved GFR, may have had some mild rhabdo initially vs. Viral inflammation of kidney plus contrast exposure from CT.   -  Minimize nephrotoxins -  PCP follow up recheck BMP as outpatient and consider nephrology consult if persists.   DVT prophylaxis:  lovenox Code Status:  full Family Communication:  Patient and mother at bedside Disposition Plan:  Home   Consultants:   Dr. Ninetta Lights, ID  Dr. Myna Hidalgo, ONC  Psychiatry by phone   Discharge Diagnoses:  Principal Problem:   Infectious mononucleosis Active Problems:   Tachycardia   Morbid obesity (HCC)   Schizoaffective  disorder (HCC)   Tachypnea   Proteinuria  Discharge Instructions  Discharge Instructions    Diet - low sodium heart healthy    Complete by:  As directed   Increase activity slowly     Complete by:  As directed       Medication List    TAKE these medications   ABILIFY MAINTENA 400 MG Susr Generic drug:  ARIPiprazole ER Inject 400 mg into the muscle every 30 (thirty) days. Last injection 06/09/16   benztropine 0.5 MG tablet Commonly known as:  COGENTIN Take 0.5 mg by mouth at bedtime.   clotrimazole 1 % cream Commonly known as:  LOTRIMIN Apply topically 2 (two) times daily.   FLUoxetine 40 MG capsule Commonly known as:  PROZAC Take 40 mg by mouth at bedtime.   fluPHENAZine 5 MG tablet Commonly known as:  PROLIXIN Take 15 mg by mouth at bedtime.   folic acid 1 MG tablet Commonly known as:  FOLVITE Take 1 tablet (1 mg total) by mouth daily.   topiramate 25 MG tablet Commonly known as:  TOPAMAX Take 50 mg by mouth at bedtime.   trazodone 300 MG tablet Commonly known as:  DESYREL Take 300 mg by mouth at bedtime.      Follow-up Information    Todd Coombe, DO. Schedule an appointment as soon as possible for a visit in 1 week(s).   Specialty:  Family Medicine Why:  Hospital Follow Up  Contact information: [address] [place] [address] [phone]        [facility] Schedule an appointment as soon as possible for a visit in 2 week(s).   Why:  Hospital Follow Up  Contact information: [address] [place] [address] [phone]        Josph Macho, MD. Schedule an appointment as soon as possible for a visit in 1 week(s).   Specialty:  Oncology Why:  Hospital Follow Up for Leukocytosis Contact information: [address] [place] [address] [phone]          No Known Allergies  Procedures/Studies: Dg Chest 2 View  Result Date: 06/27/2016 CLINICAL DATA:  Sepsis with cough EXAM: CHEST  2 VIEW COMPARISON:  None. FINDINGS: Heart and mediastinal contours are within normal limits. No focal opacities or effusions. No acute bony abnormality. IMPRESSION: No active cardiopulmonary  disease. Electronically Signed   By: Charlett Nose M.D.   On: 06/27/2016 14:57   Ct Angio Chest Pe W Or Wo Contrast  Result Date: 06/28/2016 CLINICAL DATA:  Pt states he has not been able to keep anything on his stomach for 2 weeks. He denies pain but his breathing is difficult EXAM: CT ANGIOGRAPHY CHEST WITH CONTRAST TECHNIQUE: Multidetector CT imaging of the chest was performed using the standard protocol during bolus administration of intravenous contrast. Multiplanar CT image reconstructions and MIPs were obtained to evaluate the vascular anatomy. CONTRAST:  Isovue 370 COMPARISON:  CT abdomen 06/27/2016 FINDINGS: Vascular: Right arm IV contrast administration. The SVC is patent. Right atrium and right ventricle nondilated. Satisfactory opacification of pulmonary arteries noted, and there is no evidence of pulmonary emboli. Motion degrades some images through pulmonary artery branches. Patent bilateral pulmonary veins drain into the left atrium. Adequate contrast opacification of the thoracic aorta with no evidence of dissection, aneurysm, or stenosis. There is classic 3-vessel brachiocephalic arch anatomy without proximal stenosis. Mediastinum/Lymph Nodes: Subcentimeter prevascular, precarinal, and right paratracheal nodes. No definite hilar adenopathy. No pericardial effusion.  Lungs/Pleura: Breathing motion degrades evaluation. No confluent infiltrate or nodule is evident. No pneumothorax. No pleural effusion. Upper abdomen: Small hiatal hernia. Stable borderline periportal adenopathy. No acute findings. Musculoskeletal: Mid thoracic partially calcified protrusions. Negative for fracture. Review of the MIP images confirms the above findings. IMPRESSION: 1. Negative for acute PE or thoracic aortic dissection. 2. Small hiatal hernia. Electronically Signed   By: Corlis Leak  Hassell M.D.   On: 06/28/2016 13:54   Ct Abdomen Pelvis W Contrast  Result Date: 06/27/2016 CLINICAL DATA:  Vomiting for 2 weeks. EXAM: CT  ABDOMEN AND PELVIS WITH CONTRAST TECHNIQUE: Multidetector CT imaging of the abdomen and pelvis was performed using the standard protocol following bolus administration of intravenous contrast. CONTRAST:  100mL ISOVUE-300 IOPAMIDOL (ISOVUE-300) INJECTION 61% COMPARISON:  None. FINDINGS: Lower chest: Lung bases are clear. Hepatobiliary: No focal hepatic lesion. No biliary duct dilatation. Gallbladder is normal. Common bile duct is normal. Pancreas: Pancreas is normal. No ductal dilatation. No pancreatic inflammation. Spleen: Normal spleen Adrenals/urinary tract: Adrenal glands and kidneys are normal. The ureters and bladder normal. Stomach/Bowel: Stomach, small bowel, appendix, and cecum are normal. The colon and rectosigmoid colon are normal. Vascular/Lymphatic: Abdominal aorta is normal caliber. There is no retroperitoneal or periportal lymphadenopathy. No pelvic lymphadenopathy. There are scattered periaortic and periportal lymph nodes not pathologic by size. LEFT external iliac lymph node measures 7 mm short axis (image 80, series 2). RIGHT inguinal lymph node measures 10 mm. Reproductive: Prostate normal Other: No free fluid. Musculoskeletal: No aggressive osseous lesion. IMPRESSION: 1. No acute abdominal pelvic findings. 2. Mildly prominent retroperitoneal and liac lymph nodes are not pathologic by size criteria and favored benign. Electronically Signed   By: Genevive BiStewart  Edmunds M.D.   On: 06/27/2016 19:07    Subjective: Pt says he feels great and he would like to go home.  He has no complaints.  Tolerating oral well.  No fever or chills.  No n/v/d.   Discharge Exam: Vitals:   07/01/16 2201 07/02/16 0525  BP: 134/67 124/60  Pulse: (!) 128 (!) 120  Resp: (!) 21 (!) 21  Temp: 99.2 F (37.3 C) 97.7 F (36.5 C)   Vitals:   07/01/16 0559 07/01/16 1344 07/01/16 2201 07/02/16 0525  BP: (!) 124/42 132/76 134/67 124/60  Pulse: (!) 119 (!) 118 (!) 128 (!) 120  Resp: (!) 23 17 (!) 21 (!) 21  Temp: 98.3 F  (36.8 C) 98.1 F (36.7 C) 99.2 F (37.3 C) 97.7 F (36.5 C)  TempSrc: Oral Oral Oral Oral  SpO2: 93% 96% 96% 96%  Weight:      Height:        General exam:  Adult male, tachypneic HEENT:  NCAT, MMM Respiratory system: Clear to auscultation bilaterally Cardiovascular system: tachycardic, regular rhythm, normal S1/S2. No murmurs, rubs, gallops or clicks.  Warm extremities Gastrointestinal system: Normal active bowel sounds, soft, nondistended, nontender. MSK:  Normal tone and bulk, no lower extremity edema Neuro:  Grossly intact Skin:  No purpura or hives.  No excoriations.    The results of significant diagnostics from this hospitalization (including imaging, microbiology, ancillary and laboratory) are listed below for reference.    Microbiology: Recent Results (from the past 240 hour(s))  Urine culture     Status: Abnormal   Collection Time: 06/27/16  1:20 PM  Result Value Ref Range Status   Specimen Description URINE, RANDOM  Final   Special Requests NONE  Final   Culture (A)  Final    <10,000 COLONIES/mL INSIGNIFICANT  GROWTH Performed at [facility]    Report Status 06/28/2016 FINAL  Final  Blood Culture (routine x 2)     Status: None (Preliminary result)   Collection Time: 06/27/16  3:00 PM  Result Value Ref Range Status   Specimen Description BLOOD LEFT ARM  Final   Special Requests BOTTLES DRAWN AEROBIC AND ANAEROBIC 5CC EACH  Final   Culture   Final    NO GROWTH 4 DAYS Performed at [facility]    Report Status PENDING  Incomplete  Blood Culture (routine x 2)     Status: None (Preliminary result)   Collection Time: 06/27/16  3:20 PM  Result Value Ref Range Status   Specimen Description BLOOD LEFT HAND  Final   Special Requests BOTTLES DRAWN AEROBIC AND ANAEROBIC 5CC EACH  Final   Culture   Final    NO GROWTH 4 DAYS Performed at [facility]    Report Status PENDING  Incomplete  Rapid strep screen     Status: None   Collection Time:  06/27/16  4:15 PM  Result Value Ref Range Status   Streptococcus, Group A Screen (Direct) NEGATIVE NEGATIVE Final    Comment: (NOTE) A Rapid Antigen test may result negative if the antigen level in the sample is below the detection level of this test. The FDA has not cleared this test as a stand-alone test therefore the rapid antigen negative result has reflexed to a Group A Strep culture.   Culture, group A strep     Status: None   Collection Time: 06/27/16  4:15 PM  Result Value Ref Range Status   Specimen Description THROAT  Final   Special Requests NONE Reflexed from [id number]  Final   Culture   Final    NO GROUP A STREP (S.PYOGENES) ISOLATED Performed at [facility]    Report Status 06/29/2016 FINAL  Final  Culture, blood (routine x 2)     Status: None (Preliminary result)   Collection Time: 06/29/16 11:20 AM  Result Value Ref Range Status   Specimen Description BLOOD BLOOD RIGHT HAND  Final   Special Requests IN PEDIATRIC BOTTLE 2CC  Final   Culture NO GROWTH 2 DAYS  Final   Report Status PENDING  Incomplete  Culture, blood (routine x 2)     Status: None (Preliminary result)   Collection Time: 06/29/16 11:35 AM  Result Value Ref Range Status   Specimen Description BLOOD BLOOD RIGHT HAND  Final   Special Requests IN PEDIATRIC BOTTLE 2CC  Final   Culture NO GROWTH 2 DAYS  Final   Report Status PENDING  Incomplete     Labs: BNP (last 3 results) No results for input(s): BNP in the last 8760 hours. Basic Metabolic Panel:  Recent Labs Lab 06/28/16 0048 06/28/16 0454 06/29/16 0421 06/30/16 0437 07/01/16 0420 07/02/16 0439  NA 132* 135 132* 134* 136 136  K 3.4* 3.6 3.7 3.9 5.4* 4.4  CL 101 102 102 104 110 106  CO2 24 25 24 23  21* 22  GLUCOSE 112* 105* 101* 103* 81 87  BUN 7 7 9 9 9 11   CREATININE 1.27* 1.23 1.32* 1.43* 1.38* 1.45*  CALCIUM 7.7* 7.9* 7.9* 7.9* 7.4* 8.1*  MG 2.0  --   --   --   --   --   PHOS 2.9  --   --   --   --   --    Liver Function  Tests:  Recent Labs Lab  06/27/16 1250 06/28/16 0048 06/28/16 0613  AST 109* 97* 97*  ALT 72* 68* 72*  ALKPHOS 58 47 50  BILITOT 1.2 1.1 1.1  PROT 7.3 5.9* 5.9*  ALBUMIN 2.9* 2.3* 2.2*    Recent Labs Lab 06/29/16 1410  LIPASE 18  AMYLASE 45   No results for input(s): AMMONIA in the last 168 hours. CBC:  Recent Labs Lab 06/27/16 1250 06/28/16 0048 06/28/16 5409 06/29/16 0421 06/30/16 0437 07/01/16 0420 07/02/16 0439  WBC 16.1* 13.0* 13.1* 17.2* 19.3* 20.0* 21.2*  NEUTROABS 7.4 6.3 6.2 7.6  --   --   --   HGB 12.9* 11.7* 12.2* 11.4* 11.1* 11.0* 11.2*  HCT 38.4* 35.0* 38.1* 35.8* 34.9* 34.4* 34.9*  MCV 94.1 94.3 96.9 96.5 96.7 96.9 97.2  PLT 233 209 192 214 199 162 212   Cardiac Enzymes:  Recent Labs Lab 06/28/16 1805 06/29/16 1830  CKTOTAL  --  875*  TROPONINI <0.03  --    BNP: Invalid input(s): POCBNP CBG: No results for input(s): GLUCAP in the last 168 hours. D-Dimer No results for input(s): DDIMER in the last 72 hours. Hgb A1c  Recent Labs  06/29/16 1450  HGBA1C 4.8   Lipid Profile No results for input(s): CHOL, HDL, LDLCALC, TRIG, CHOLHDL, LDLDIRECT in the last 72 hours. Thyroid function studies No results for input(s): TSH, T4TOTAL, T3FREE, THYROIDAB in the last 72 hours.  Invalid input(s): FREET3 Anemia work up No results for input(s): VITAMINB12, FOLATE, FERRITIN, TIBC, IRON, RETICCTPCT in the last 72 hours. Urinalysis    Component Value Date/Time   COLORURINE AMBER (A) 06/30/2016 1441   APPEARANCEUR CLEAR 06/30/2016 1441   LABSPEC 1.016 06/30/2016 1441   PHURINE 6.0 06/30/2016 1441   GLUCOSEU NEGATIVE 06/30/2016 1441   HGBUR MODERATE (A) 06/30/2016 1441   BILIRUBINUR SMALL (A) 06/30/2016 1441   KETONESUR NEGATIVE 06/30/2016 1441   PROTEINUR >300 (A) 06/30/2016 1441   NITRITE NEGATIVE 06/30/2016 1441   LEUKOCYTESUR NEGATIVE 06/30/2016 1441   Sepsis Labs Invalid input(s): PROCALCITONIN,  WBC,  LACTICIDVEN Microbiology Recent  Results (from the past 240 hour(s))  Urine culture     Status: Abnormal   Collection Time: 06/27/16  1:20 PM  Result Value Ref Range Status   Specimen Description URINE, RANDOM  Final   Special Requests NONE  Final   Culture (A)  Final    <10,000 COLONIES/mL INSIGNIFICANT GROWTH Performed at [facility]    Report Status 06/28/2016 FINAL  Final  Blood Culture (routine x 2)     Status: None (Preliminary result)   Collection Time: 06/27/16  3:00 PM  Result Value Ref Range Status   Specimen Description BLOOD LEFT ARM  Final   Special Requests BOTTLES DRAWN AEROBIC AND ANAEROBIC 5CC EACH  Final   Culture   Final    NO GROWTH 4 DAYS Performed at [facility]    Report Status PENDING  Incomplete  Blood Culture (routine x 2)     Status: None (Preliminary result)   Collection Time: 06/27/16  3:20 PM  Result Value Ref Range Status   Specimen Description BLOOD LEFT HAND  Final   Special Requests BOTTLES DRAWN AEROBIC AND ANAEROBIC 5CC EACH  Final   Culture   Final    NO GROWTH 4 DAYS Performed at [facility]    Report Status PENDING  Incomplete  Rapid strep screen     Status: None   Collection Time: 06/27/16  4:15 PM  Result Value Ref Range Status  Streptococcus, Group A Screen (Direct) NEGATIVE NEGATIVE Final    Comment: (NOTE) A Rapid Antigen test may result negative if the antigen level in the sample is below the detection level of this test. The FDA has not cleared this test as a stand-alone test therefore the rapid antigen negative result has reflexed to a Group A Strep culture.   Culture, group A strep     Status: None   Collection Time: 06/27/16  4:15 PM  Result Value Ref Range Status   Specimen Description THROAT  Final   Special Requests NONE Reflexed from [id number]  Final   Culture   Final    NO GROUP A STREP (S.PYOGENES) ISOLATED Performed at [facility]    Report Status 06/29/2016 FINAL  Final  Culture, blood (routine x 2)     Status:  None (Preliminary result)   Collection Time: 06/29/16 11:20 AM  Result Value Ref Range Status   Specimen Description BLOOD BLOOD RIGHT HAND  Final   Special Requests IN PEDIATRIC BOTTLE 2CC  Final   Culture NO GROWTH 2 DAYS  Final   Report Status PENDING  Incomplete  Culture, blood (routine x 2)     Status: None (Preliminary result)   Collection Time: 06/29/16 11:35 AM  Result Value Ref Range Status   Specimen Description BLOOD BLOOD RIGHT HAND  Final   Special Requests IN PEDIATRIC BOTTLE 2CC  Final   Culture NO GROWTH 2 DAYS  Final   Report Status PENDING  Incomplete   Time coordinating discharge: Over 30 minutes  SIGNED:  Standley Dakins, MD  [facility] 07/02/2016, 12:03 PM Pager   If 7PM-7AM, please contact night-coverage [email or web address] Password [email or web address]

## 2016-07-04 LAB — CULTURE, BLOOD (ROUTINE X 2)
CULTURE: NO GROWTH
Culture: NO GROWTH

## 2016-07-15 ENCOUNTER — Emergency Department (HOSPITAL_BASED_OUTPATIENT_CLINIC_OR_DEPARTMENT_OTHER)
Admission: EM | Admit: 2016-07-15 | Discharge: 2016-07-15 | Disposition: A | Discharge status: Home / Self Care | Payer: Medicaid Other | Attending: Emergency Medicine | Admitting: Emergency Medicine

## 2016-07-15 ENCOUNTER — Encounter (HOSPITAL_BASED_OUTPATIENT_CLINIC_OR_DEPARTMENT_OTHER): Payer: Self-pay | Admitting: Emergency Medicine

## 2016-07-15 DIAGNOSIS — F17200 Nicotine dependence, unspecified, uncomplicated: Secondary | ICD-10-CM | POA: Insufficient documentation

## 2016-07-15 DIAGNOSIS — R7989 Other specified abnormal findings of blood chemistry: Secondary | ICD-10-CM

## 2016-07-15 DIAGNOSIS — R112 Nausea with vomiting, unspecified: Secondary | ICD-10-CM

## 2016-07-15 LAB — URINALYSIS, ROUTINE W REFLEX MICROSCOPIC
BILIRUBIN URINE: NEGATIVE
GLUCOSE, UA: 250 mg/dL — AB
KETONES UR: NEGATIVE mg/dL
Leukocytes, UA: NEGATIVE
NITRITE: NEGATIVE
PH: 7 (ref 5.0–8.0)
Protein, ur: >300 mg/dL — AB
SPECIFIC GRAVITY, URINE: 1.014 (ref 1.005–1.030)

## 2016-07-15 LAB — CBC WITH DIFFERENTIAL/PLATELET
BASOS ABS: 0 10*3/uL (ref 0.0–0.1)
BASOS PCT: 0 %
EOS ABS: 0.1 10*3/uL (ref 0.0–0.7)
Eosinophils Relative: 1 %
HEMATOCRIT: 37.3 % — AB (ref 39.0–52.0)
HEMOGLOBIN: 12.4 g/dL — AB (ref 13.0–17.0)
LYMPHS PCT: 43 %
Lymphs Abs: 4.8 10*3/uL — ABNORMAL HIGH (ref 0.7–4.0)
MCH: 31.6 pg (ref 26.0–34.0)
MCHC: 33.2 g/dL (ref 30.0–36.0)
MCV: 94.9 fL (ref 78.0–100.0)
MONO ABS: 1 10*3/uL (ref 0.1–1.0)
Monocytes Relative: 9 %
NEUTROS PCT: 47 %
Neutro Abs: 5.3 10*3/uL (ref 1.7–7.7)
Platelets: 391 10*3/uL (ref 150–400)
RBC: 3.93 MIL/uL — AB (ref 4.22–5.81)
RDW: 12.4 % (ref 11.5–15.5)
WBC: 11.2 10*3/uL — AB (ref 4.0–10.5)

## 2016-07-15 LAB — COMPREHENSIVE METABOLIC PANEL
ALBUMIN: 1.4 g/dL — AB (ref 3.5–5.0)
ALK PHOS: 98 U/L (ref 38–126)
ALT: 52 U/L (ref 17–63)
AST: 52 U/L — AB (ref 15–41)
Anion gap: 4 — ABNORMAL LOW (ref 5–15)
BILIRUBIN TOTAL: 0.4 mg/dL (ref 0.3–1.2)
BUN: 13 mg/dL (ref 6–20)
CALCIUM: 7.7 mg/dL — AB (ref 8.9–10.3)
CO2: 21 mmol/L — AB (ref 22–32)
CREATININE: 2.18 mg/dL — AB (ref 0.61–1.24)
Chloride: 111 mmol/L (ref 101–111)
GFR calc Af Amer: 48 mL/min — ABNORMAL LOW (ref >60)
GFR calc non Af Amer: 42 mL/min — ABNORMAL LOW (ref >60)
GLUCOSE: 98 mg/dL (ref 65–99)
Potassium: 3.7 mmol/L (ref 3.5–5.1)
SODIUM: 136 mmol/L (ref 135–145)
TOTAL PROTEIN: 5.4 g/dL — AB (ref 6.5–8.1)

## 2016-07-15 LAB — URINE MICROSCOPIC-ADD ON: WBC UA: NONE SEEN WBC/hpf (ref 0–5)

## 2016-07-15 LAB — LIPASE, BLOOD: Lipase: 30 U/L (ref 11–51)

## 2016-07-15 MED ORDER — ONDANSETRON HCL 4 MG/2ML IJ SOLN
4.0000 mg | Freq: Once | INTRAMUSCULAR | Status: AC
  Administered 2016-07-15: 4 mg via INTRAVENOUS
  Filled 2016-07-15: qty 2

## 2016-07-15 MED ORDER — SODIUM CHLORIDE 0.9 % IV BOLUS (SEPSIS)
1000.0000 mL | Freq: Once | INTRAVENOUS | Status: AC
  Administered 2016-07-15: 1000 mL via INTRAVENOUS

## 2016-07-15 NOTE — ED Provider Notes (Signed)
[facility] Provider Note   CSN: [id number] Arrival date & time: 07/15/16  1608   By signing my name below, I, Nelwyn Salisbury, attest that this documentation has been prepared under the direction and in the presence of non-physician practitioner, Jaynie Crumble, PA-C Electronically Signed: Nelwyn Salisbury, Scribe. 07/15/2016. 4:32 PM.    History   Chief Complaint Chief Complaint  Patient presents with  . Abnormal Lab   The history is provided by the patient. No language interpreter was used.    HPI Comments:  Todd Burton is a 20 y.o. male who presents to the Emergency Department complaining of chronic unchanged vomiting onset 2 weeks ago. Pt reports that he was seen yesterday by his PCP was given a blood test. He was called today and told that his kidney levels were abnormal and to come to the ED. Pt endorses associated nausea and resolved diarrhea. He denies any abdominal pain, sore throat, constipation or changes in energy level. Pt notes that he is able to drink water but can not keep food in his stomach without vomiting. Recent admission to the hospital for sepsis due to EBV infection, discharged 2 weeks ago. States felling well with no fever or chills otherwise, normal energy level   Past Medical History:  Diagnosis Date  . Obesity   . Schizoaffective disorder [facility])     Patient Active Problem List   Diagnosis Date Noted  . Proteinuria 07/02/2016  . Infectious mononucleosis 06/30/2016  . UTI (urinary tract infection) 06/28/2016  . Tachycardia 06/28/2016  . Morbid obesity (HCC) 06/28/2016  . Schizoaffective disorder (HCC) 06/28/2016  . Tachypnea 06/28/2016    History reviewed. No pertinent surgical history.     Home Medications    Prior to Admission medications   Medication Sig Start Date End Date Taking? Authorizing Provider  ARIPiprazole ER (ABILIFY MAINTENA) 400 MG SUSR Inject 400 mg into the muscle every 30 (thirty) days. Last injection 06/09/16     Historical Provider, MD  benztropine (COGENTIN) 0.5 MG tablet Take 0.5 mg by mouth at bedtime. 07/27/14   Historical Provider, MD  clotrimazole (LOTRIMIN) 1 % cream Apply topically 2 (two) times daily. 07/02/16   Clanford Cyndie Mull, MD  FLUoxetine (PROZAC) 40 MG capsule Take 40 mg by mouth at bedtime. 06/18/16   Historical Provider, MD  fluPHENAZine (PROLIXIN) 5 MG tablet Take 15 mg by mouth at bedtime.     Historical Provider, MD  folic acid (FOLVITE) 1 MG tablet Take 1 tablet (1 mg total) by mouth daily. 07/02/16   Clanford Cyndie Mull, MD  topiramate (TOPAMAX) 25 MG tablet Take 50 mg by mouth at bedtime.     Historical Provider, MD  trazodone (DESYREL) 300 MG tablet Take 300 mg by mouth at bedtime.    Historical Provider, MD    Family History Family History  Problem Relation Age of Onset  . Pancreatic cancer Maternal Grandfather   . Pancreatic cancer Paternal Grandmother     Social History Social History  Substance Use Topics  . Smoking status: Current Every Day Smoker  . Smokeless tobacco: Never Used  . Alcohol use Yes     Comment: weekend     Allergies   Review of patient's allergies indicates no known allergies.   Review of Systems Review of Systems  Constitutional: Negative for activity change.  HENT: Negative for sore throat.   Gastrointestinal: Positive for nausea and vomiting. Negative for abdominal pain, constipation and diarrhea (Resolved).  All other systems reviewed and are  negative.    Physical Exam Updated Vital Signs BP 134/79 (BP Location: Left Arm)   Pulse 117   Temp 98.1 F (36.7 C) (Oral)   Resp 20   Ht 6' (1.829 m)   Wt (!) 351 lb (159.2 kg)   SpO2 98%   BMI 47.60 kg/m   Physical Exam  Constitutional: He is oriented to person, place, and time. He appears well-developed and well-nourished. No distress.  HENT:  Head: Normocephalic and atraumatic.  Eyes: Conjunctivae are normal.  Cardiovascular: Regular rhythm and normal heart sounds.     tahycardic  Pulmonary/Chest: Effort normal. No respiratory distress. He has no wheezes. He has no rales.  Abdominal: Soft. Bowel sounds are normal. He exhibits no distension. There is no tenderness. There is no guarding.  Neurological: He is alert and oriented to person, place, and time.  Skin: Skin is warm and dry.  Psychiatric: He has a normal mood and affect.  Nursing note and vitals reviewed.    ED Treatments / Results  DIAGNOSTIC STUDIES:  Oxygen Saturation is 98% on RA, normal by my interpretation.    COORDINATION OF CARE:  4:24 PM Discussed treatment plan with pt at bedside which included IV fluids and pt agreed to plan.  Labs (all labs ordered are listed, but only abnormal results are displayed) Labs Reviewed - No data to display  EKG  EKG Interpretation None       Radiology No results found.  Procedures Procedures (including critical care time)  Medications Ordered in ED Medications - No data to display   Initial Impression / Assessment and Plan / ED Course  I have reviewed the triage vital signs and the nursing notes.  Pertinent labs & imaging results that were available during my care of the patient were reviewed by me and considered in my medical decision making (see chart for details).  Clinical Course  Comment By Time  Pt seen and examined. Pt with elevated createnine at PCP's office yesterday, tachycardia. Pt continues to have emesis after any solid foods at home, otherwise no complaints. Will recheck labs, fluids ordered.  Jaynie Crumbleatyana Tashae Inda, PA-C 09/12 1651  Creatinine 2.1 here today. This has trended down since yesterday's creatinine of 2.45. Patient hydrated with IV saline. Patient's heart rate came down with fluids. He was able to drink water and eat crackers without vomiting. Discussed with Dr. Eudelia Bunchardama, plan to have him follow-up closely with his doctor regarding his elevated creatinine and possible nephrology consult. At this time it do not  think patient needs admission, return precautions discussed. Jaynie Crumbleatyana Vanden Fawaz, PA-C 09/12 1856     Final Clinical Impressions(s) / ED Diagnoses   Final diagnoses:  Elevated serum creatinine  Non-intractable vomiting with nausea, vomiting of unspecified type    New Prescriptions Discharge Medication List as of 07/15/2016  6:58 PM      I personally performed the services described in this documentation, which was scribed in my presence. The recorded information has been reviewed and is accurate.    Jaynie Crumbleatyana Nyron Mozer, PA-C 07/17/16 0010    Nira ConnPedro Eduardo Cardama, MD 07/17/16 0230

## 2016-07-15 NOTE — Discharge Instructions (Signed)
Take nausea medicine as prescribed by a doctor. Please follow-up with a primary care doctor within next 1-2 days for further workup of your kidney function and vomiting. Bland diet, drink plenty of fluids. Return if worsening.

## 2016-07-15 NOTE — ED Triage Notes (Signed)
Family member has a copy of the lab work on phone. Creatine was elevated to 2.45

## 2016-07-15 NOTE — ED Triage Notes (Signed)
Patient reports that he went to the Dr. Burgess EstelleYesterday for follow up from when he was in the hospital. Dr. Jeanene Erballed the patient and told him to come back because of abnormal kidney function

## 2016-07-15 NOTE — ED Notes (Signed)
PA at bedside.

## 2016-07-15 NOTE — ED Notes (Signed)
PA at bedside discussing test results and dispo plan of care. 

## 2018-02-09 IMAGING — CT CT ANGIO CHEST
2 of 6 series · 18 of 36 positions shown · IV contrast (Omni 300)
Comparison: CT abdomen 06/27/2016

CLINICAL DATA: Pt states he has not been able to keep anything on
his stomach for 2 weeks. He denies pain but his breathing is
difficult

EXAM:
CT ANGIOGRAPHY CHEST WITH CONTRAST
TECHNIQUE: Multidetector CT imaging of the chest was performed using the
standard protocol during bolus administration of intravenous
contrast. Multiplanar CT image reconstructions and MIPs were
obtained to evaluate the vascular anatomy.
CONTRAST:  Isovue 370 100ml

[Series 6: pe thins · axial · 0.77mm/px · z∈[+1204,+1468]mm · 17 of 299 slices shown]
[im 17/299  lung]
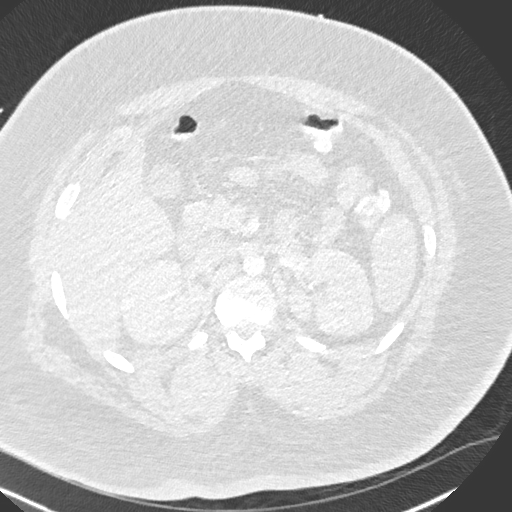
[im 34/299  mediastinal]
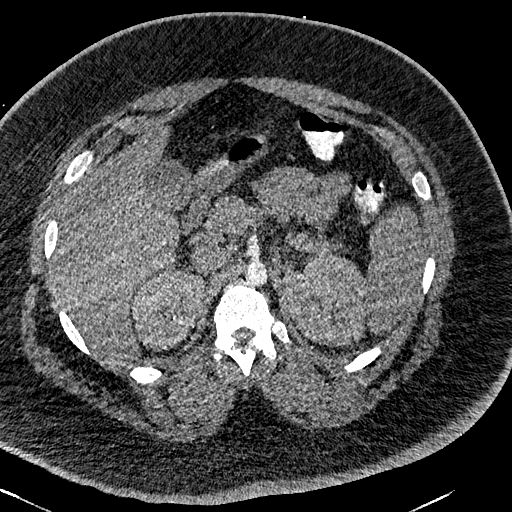
[im 50/299  lung]
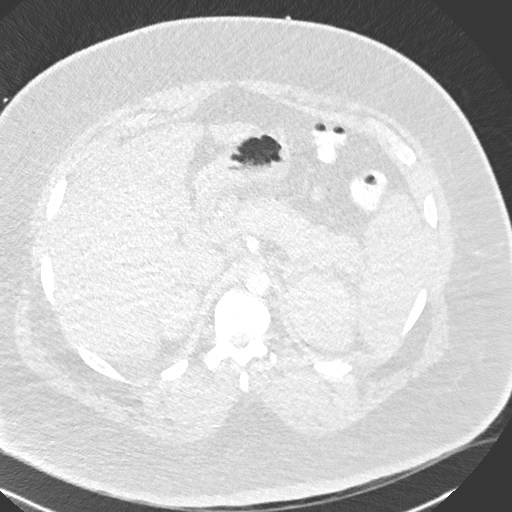
[im 67/299  mediastinal]
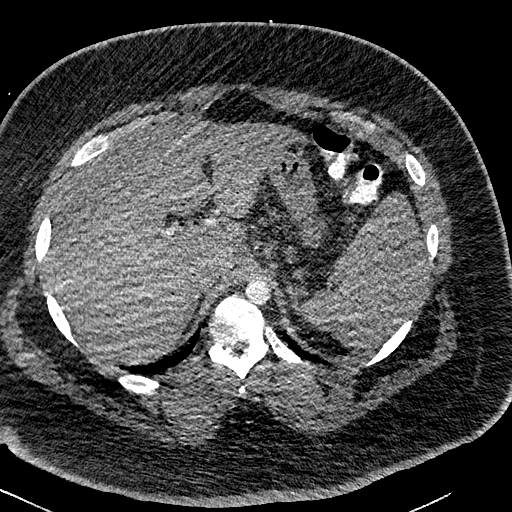
[im 83/299  lung]
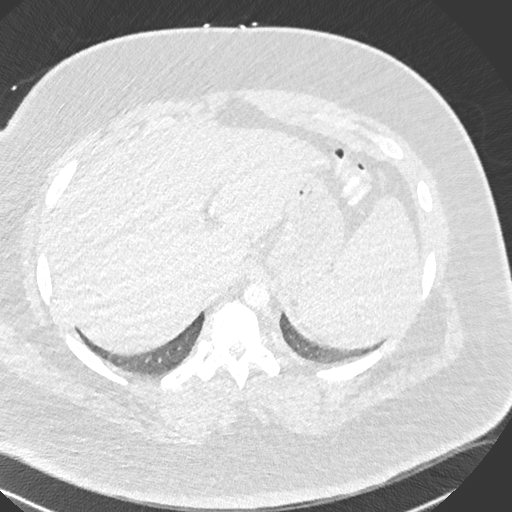
[im 100/299  mediastinal]
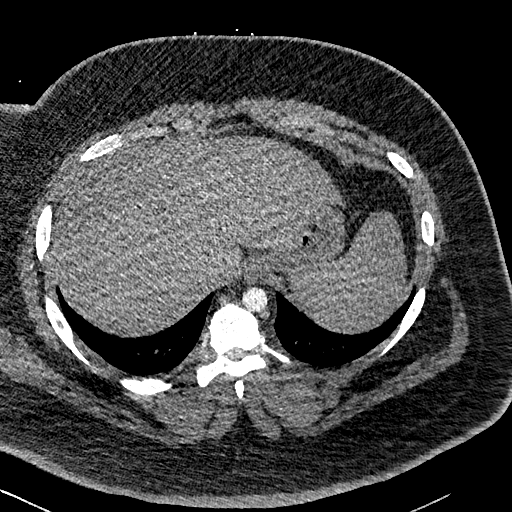
[im 116/299  lung]
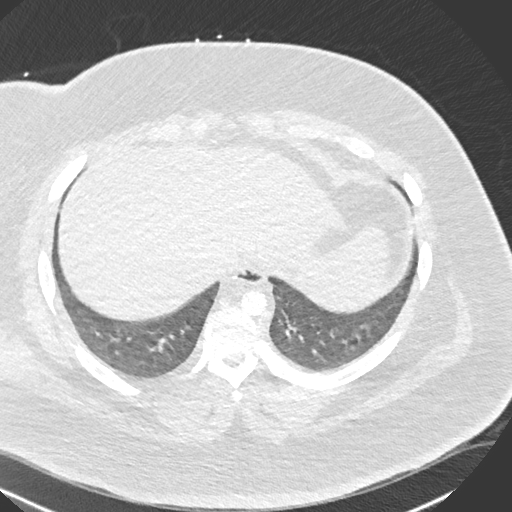
[im 133/299  mediastinal]
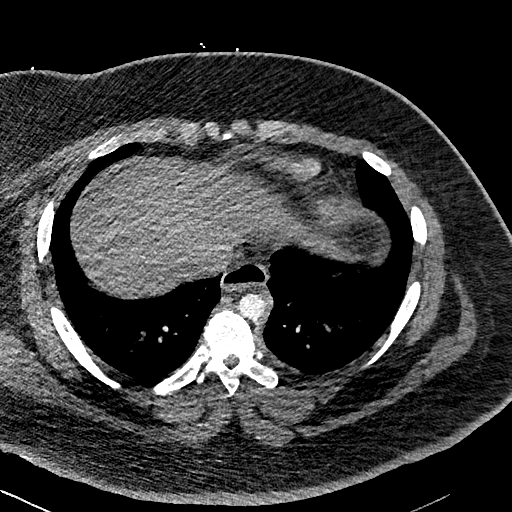
[im 150/299  lung]
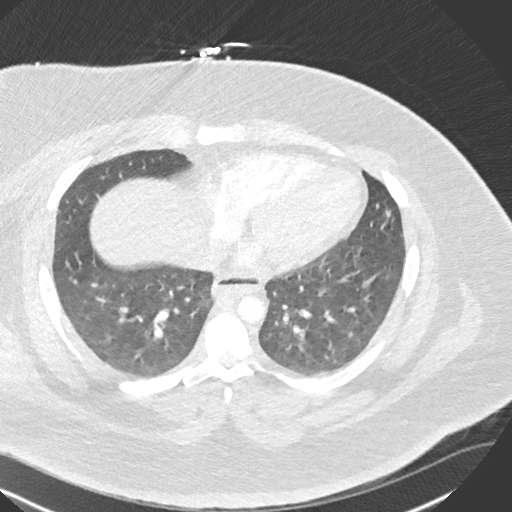
[im 166/299  mediastinal]
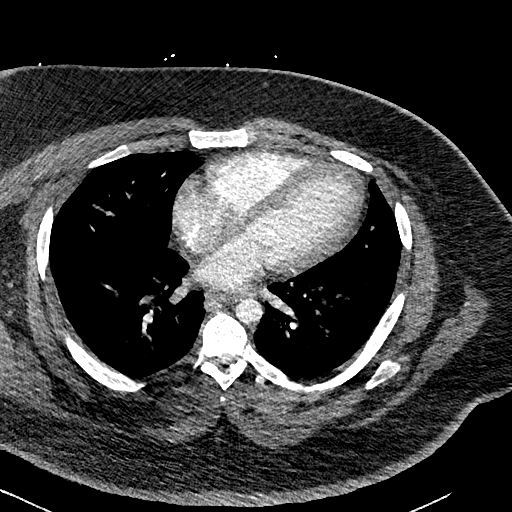
[im 183/299  lung]
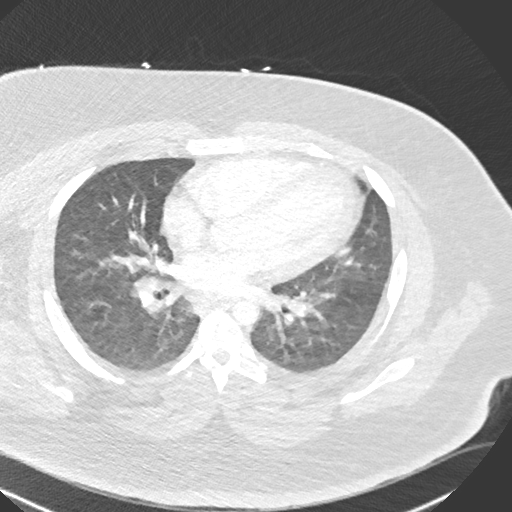
[im 199/299  mediastinal]
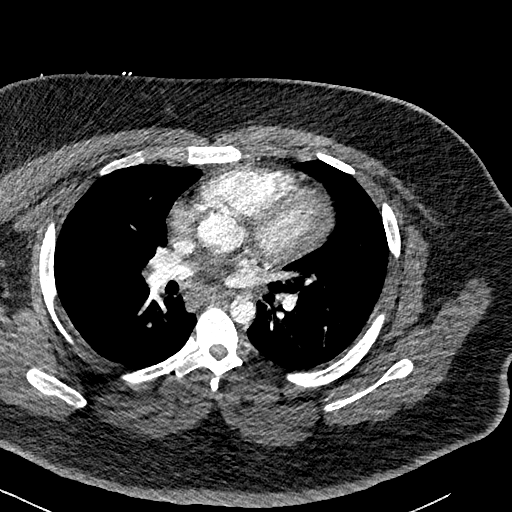
[im 216/299  lung]
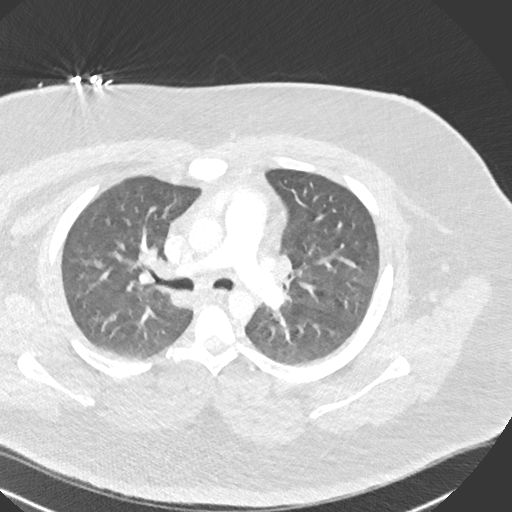
[im 232/299  mediastinal]
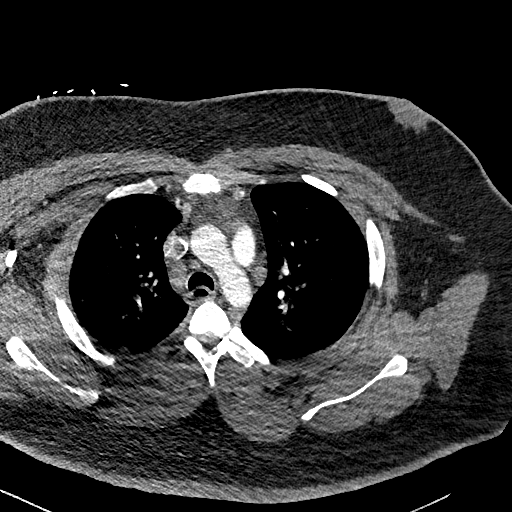
[im 249/299  lung]
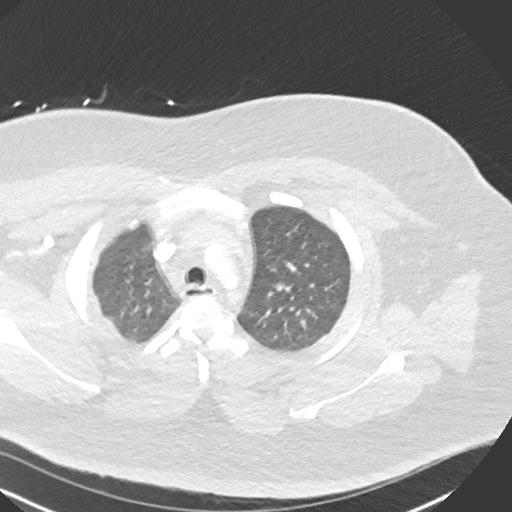
[im 265/299  mediastinal]
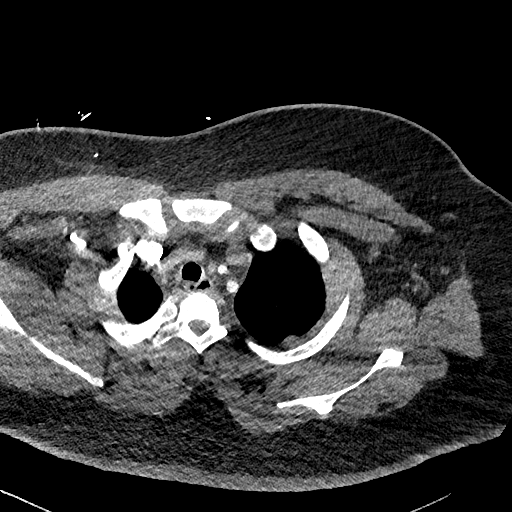
[im 282/299  lung]
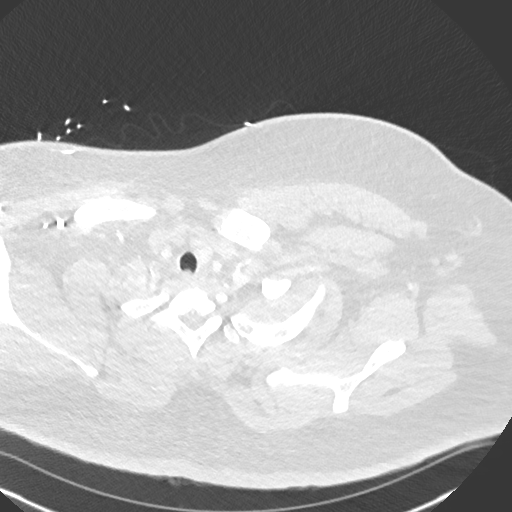

[Series 7: pe 2mm cor · coronal · 0.55mm/px · 1 of 151 slices shown]
[im 76/151  mediastinal]
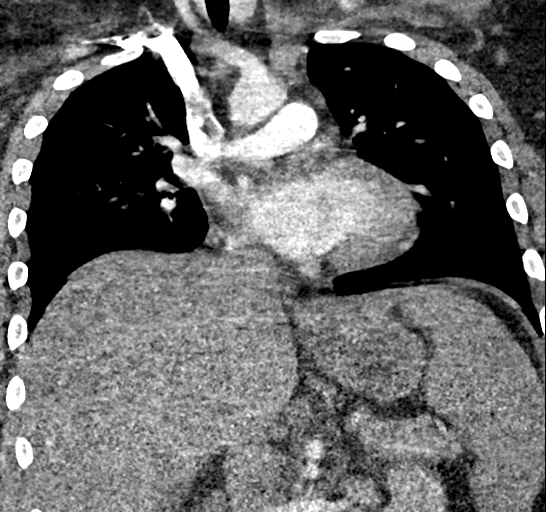

[18 of 36 positions shown; findings below may reference images not displayed]

FINDINGS: Vascular: Right arm IV contrast administration. The SVC is patent.
Right atrium and right ventricle nondilated. Satisfactory
opacification of pulmonary arteries noted, and there is no evidence
of pulmonary emboli. Motion degrades some images through pulmonary
artery branches. Patent bilateral pulmonary veins drain into the
left atrium. Adequate contrast opacification of the thoracic aorta
with no evidence of dissection, aneurysm, or stenosis. There is
classic 3-vessel brachiocephalic arch anatomy without proximal
stenosis.

Mediastinum/Lymph Nodes: Subcentimeter prevascular, precarinal, and
right paratracheal nodes. No definite hilar adenopathy. No
pericardial effusion.

Lungs/Pleura: Breathing motion degrades evaluation. No confluent
infiltrate or nodule is evident. No pneumothorax. No pleural
effusion.

Upper abdomen: Small hiatal hernia. Stable borderline periportal
adenopathy. No acute findings.

Musculoskeletal: Mid thoracic partially calcified protrusions.
Negative for fracture.

Review of the MIP images confirms the above findings.
IMPRESSION: 1. Negative for acute PE or thoracic aortic dissection.
2. Small hiatal hernia.

## 2019-09-20 ENCOUNTER — Emergency Department (HOSPITAL_COMMUNITY)
Admission: EM | Admit: 2019-09-20 | Discharge: 2019-09-21 | Disposition: A | Discharge status: Home / Self Care | Payer: Medicaid Other | Attending: Emergency Medicine | Admitting: Emergency Medicine

## 2019-09-20 ENCOUNTER — Other Ambulatory Visit: Payer: Self-pay

## 2019-09-20 DIAGNOSIS — R45851 Suicidal ideations: Secondary | ICD-10-CM | POA: Insufficient documentation

## 2019-09-20 DIAGNOSIS — Z79899 Other long term (current) drug therapy: Secondary | ICD-10-CM | POA: Diagnosis not present

## 2019-09-20 DIAGNOSIS — Z20828 Contact with and (suspected) exposure to other viral communicable diseases: Secondary | ICD-10-CM | POA: Diagnosis not present

## 2019-09-20 DIAGNOSIS — F259 Schizoaffective disorder, unspecified: Secondary | ICD-10-CM | POA: Diagnosis not present

## 2019-09-20 DIAGNOSIS — F17210 Nicotine dependence, cigarettes, uncomplicated: Secondary | ICD-10-CM | POA: Diagnosis not present

## 2019-09-20 DIAGNOSIS — Z046 Encounter for general psychiatric examination, requested by authority: Secondary | ICD-10-CM | POA: Diagnosis present

## 2019-09-20 DIAGNOSIS — F1994 Other psychoactive substance use, unspecified with psychoactive substance-induced mood disorder: Secondary | ICD-10-CM | POA: Diagnosis not present

## 2019-09-20 LAB — COMPREHENSIVE METABOLIC PANEL
ALT: 16 U/L (ref 0–44)
AST: 19 U/L (ref 15–41)
Albumin: 4 g/dL (ref 3.5–5.0)
Alkaline Phosphatase: 62 U/L (ref 38–126)
Anion gap: 10 (ref 5–15)
BUN: 14 mg/dL (ref 6–20)
CO2: 21 mmol/L — ABNORMAL LOW (ref 22–32)
Calcium: 9.2 mg/dL (ref 8.9–10.3)
Chloride: 107 mmol/L (ref 98–111)
Creatinine, Ser: 1.48 mg/dL — ABNORMAL HIGH (ref 0.61–1.24)
GFR calc Af Amer: >60 mL/min (ref >60)
GFR calc non Af Amer: >60 mL/min (ref >60)
Glucose, Bld: 106 mg/dL — ABNORMAL HIGH (ref 70–99)
Potassium: 4.2 mmol/L (ref 3.5–5.1)
Sodium: 138 mmol/L (ref 135–145)
Total Bilirubin: 1.5 mg/dL — ABNORMAL HIGH (ref 0.3–1.2)
Total Protein: 7.9 g/dL (ref 6.5–8.1)

## 2019-09-20 LAB — ETHANOL: Alcohol, Ethyl (B): <10 mg/dL (ref <10)

## 2019-09-20 LAB — CBC WITH DIFFERENTIAL/PLATELET
Abs Immature Granulocytes: 0.07 10*3/uL (ref 0.00–0.07)
Basophils Absolute: 0.1 10*3/uL (ref 0.0–0.1)
Basophils Relative: 0 %
Eosinophils Absolute: 0.1 10*3/uL (ref 0.0–0.5)
Eosinophils Relative: 1 %
HCT: 47.9 % (ref 39.0–52.0)
Hemoglobin: 15.3 g/dL (ref 13.0–17.0)
Immature Granulocytes: 0 %
Lymphocytes Relative: 16 %
Lymphs Abs: 2.7 10*3/uL (ref 0.7–4.0)
MCH: 32.4 pg (ref 26.0–34.0)
MCHC: 31.9 g/dL (ref 30.0–36.0)
MCV: 101.5 fL — ABNORMAL HIGH (ref 80.0–100.0)
Monocytes Absolute: 1.1 10*3/uL — ABNORMAL HIGH (ref 0.1–1.0)
Monocytes Relative: 7 %
Neutro Abs: 13.1 10*3/uL — ABNORMAL HIGH (ref 1.7–7.7)
Neutrophils Relative %: 76 %
Platelets: 301 10*3/uL (ref 150–400)
RBC: 4.72 MIL/uL (ref 4.22–5.81)
RDW: 12.1 % (ref 11.5–15.5)
WBC: 17.2 10*3/uL — ABNORMAL HIGH (ref 4.0–10.5)
nRBC: 0 % (ref 0.0–0.2)

## 2019-09-20 LAB — ACETAMINOPHEN LEVEL: Acetaminophen (Tylenol), Serum: <10 ug/mL — ABNORMAL LOW (ref 10–30)

## 2019-09-20 LAB — LACTIC ACID, PLASMA
Lactic Acid, Venous: 1.1 mmol/L (ref 0.5–1.9)
Lactic Acid, Venous: 1.1 mmol/L (ref 0.5–1.9)

## 2019-09-20 LAB — SALICYLATE LEVEL: Salicylate Lvl: <7 mg/dL (ref 2.8–30.0)

## 2019-09-20 MED ORDER — SODIUM CHLORIDE 0.9 % IV BOLUS
1000.0000 mL | Freq: Once | INTRAVENOUS | Status: AC
  Administered 2019-09-20: 1000 mL via INTRAVENOUS

## 2019-09-20 MED ORDER — CHLORPROMAZINE HCL 25 MG PO TABS
50.0000 mg | ORAL_TABLET | Freq: Two times a day (BID) | ORAL | Status: DC
  Administered 2019-09-20 – 2019-09-21 (×2): 50 mg via ORAL
  Filled 2019-09-20 (×2): qty 2

## 2019-09-20 MED ORDER — TRAZODONE HCL 100 MG PO TABS
300.0000 mg | ORAL_TABLET | Freq: Every day | ORAL | Status: DC
  Administered 2019-09-20: 300 mg via ORAL
  Filled 2019-09-20: qty 3

## 2019-09-20 MED ORDER — TOPIRAMATE 100 MG PO TABS
100.0000 mg | ORAL_TABLET | Freq: Every evening | ORAL | Status: DC
  Administered 2019-09-20: 100 mg via ORAL
  Filled 2019-09-20: qty 1

## 2019-09-20 MED ORDER — FLUPHENAZINE HCL 5 MG PO TABS
15.0000 mg | ORAL_TABLET | Freq: Every day | ORAL | Status: DC
  Administered 2019-09-20: 15 mg via ORAL
  Filled 2019-09-20 (×2): qty 3

## 2019-09-20 MED ORDER — LISINOPRIL 10 MG PO TABS
10.0000 mg | ORAL_TABLET | Freq: Every evening | ORAL | Status: DC
  Administered 2019-09-20: 10 mg via ORAL
  Filled 2019-09-20: qty 1

## 2019-09-20 MED ORDER — AMANTADINE HCL 100 MG PO CAPS
100.0000 mg | ORAL_CAPSULE | Freq: Two times a day (BID) | ORAL | Status: DC
  Administered 2019-09-20 – 2019-09-21 (×2): 100 mg via ORAL
  Filled 2019-09-20 (×2): qty 1

## 2019-09-20 NOTE — ED Triage Notes (Signed)
Pt guardian at bedside: Pt has been off meds for 1 week. Guardian reports that she had to call police this am because pt was SI and HI. Pt is denying Si/ HI at this time. Pt is calm and cooperative at this time. Pts guardian reports pt has been having outburst recently.

## 2019-09-20 NOTE — BH Assessment (Addendum)
Tele Assessment Note   Patient Name: Todd Burton MRN: [id number] Referring Physician: Eustaquio Maize, PA-C. Location of Patient: [facility], [phone].  Location of Provider: [place] is an 23 y.o. male, who present involuntary and unaccompanied to [facility]. Pt was a poor historian during the assessment. Clinician asked the pt, "what brought you to the hospital?" Pt reported, "my mom made me come." Pt reported, his mother thought he was "trippin on something." Pt reported, access to knives. Pt denies, SI, HI, AVH, self-injurious behaviors.   Pt denies history of abuse and substance use. Pt's UDS is pending. Pt reported, being linked to Dr. Darleene Cleaver for medication management. Pt unable to list current medications. Pt reports, taking all medications as prescribed. Pt reported, not seeing his psychiatrist in a while. Pt denies, previous inpatient admissions.   Pt presents quiet, awake drowsy with logical, coherent speech. Pt's mood was anxious. Pt's affect was flat. Pt's judgement was partial. Pt was oriented x2. Pt's concentration and impulse control was fair. Pt's insight was poor. During the assessment the pt continued to ask to leave as he came to the hospital voluntarily. Pt reported, if discharged from [facility] he could contract for safety. Clinician discussed the three possible dispositions (discharged with OPT resources, observe/reassess by psychiatry or inpatient treatment) in detail.   Diagnosis: Schizoaffective disorder [facility])  Past Medical History:  Past Medical History:  Diagnosis Date  . Obesity   . Schizoaffective disorder ([place])     No past surgical history on file.  Family History:  Family History  Problem Relation Age of Onset  . Pancreatic cancer Maternal Grandfather   . Pancreatic cancer Paternal Grandmother     Social History:  reports that he has been smoking. He has never used smokeless tobacco. He reports current alcohol use. He reports  that he does not use drugs.  Additional Social History:  Alcohol / Drug Use Pain Medications: See MAR Prescriptions: See MAR Over the Counter: See MAR History of alcohol / drug use?: (Pt denies however UDS is pending.)  CIWA: CIWA-Ar BP: 135/64 Pulse Rate: (!) 106 COWS:    Allergies: No Known Allergies  Home Medications: (Not in a hospital admission)   OB/GYN Status:  No LMP for male patient.  General Assessment Data [facility] of Assessment: [facility] TTS Assessment: In system Is this a Tele or Face-to-Face Assessment?: Tele Assessment Is this an Initial Assessment or a Re-assessment for this encounter?: Initial Assessment Patient Accompanied by:: N/A Language Other than English: No Living Arrangements: Other (Comment)(Friend. ) What gender do you identify as?: Male Marital status: Single Living Arrangements: Non-relatives/Friends Can pt return to current living arrangement?: Yes Admission Status: Involuntary Petitioner: Family member(Mother. ) Is patient capable of signing voluntary admission?: No Referral Source: Self/Family/Friend Insurance type: Medicaid.      Crisis Care Plan Living Arrangements: Non-relatives/Friends Legal Guardian: Other:(Self (per pt)) Name of Psychiatrist: Dr. Darleene Cleaver Name of Therapist: NA  Education Status Is patient currently in school?: No Is the patient employed, unemployed or receiving disability?: Receiving disability income  Risk to self with the past 6 months Suicidal Ideation: Yes-Currently Present(Per IVC however pt denies. ) Has patient been a risk to self within the past 6 months prior to admission? : No(Pt denies. ) Suicidal Intent: No Has patient had any suicidal intent within the past 6 months prior to admission? : No(Pt denies. ) Is patient at risk for suicide?: Yes Suicidal Plan?: No(Pt denies. ) Has patient had any  suicidal plan within the past 6 months prior to admission? : No(Pt denies. ) Access to Means: Yes Specify  Access to Suicidal Means: Pt reported, access to knives.  What has been your use of drugs/alcohol within the last 12 months?: UDS is pending. Pt denies use.  Previous Attempts/Gestures: No(Pt denies. ) How many times?: 0 Other Self Harm Risks: Pt denies.  Triggers for Past Attempts: None known(Pt denies.) Intentional Self Injurious Behavior: None(Pt denies. ) Family Suicide History: No Recent stressful life event(s): Other (Comment)(Pt denies, stressors. ) Persecutory voices/beliefs?: No Depression: No(Pt denies. ) Depression Symptoms: (Pt denies. ) Substance abuse history and/or treatment for substance abuse?: No Suicide prevention information given to non-admitted patients: Not applicable  Risk to Others within the past 6 months Homicidal Ideation: No(Pt denies. ) Does patient have any lifetime risk of violence toward others beyond the six months prior to admission? : No(Pt denies. ) Thoughts of Harm to Others: Yes-Currently Present(Per IVC however pt denies. ) Comment - Thoughts of Harm to Others: Per IVC, "threatening to harm himself and someone else."  Current Homicidal Intent: No(Pt denies. ) Current Homicidal Plan: No Access to Homicidal Means: Yes Describe Access to Homicidal Means: Pt reported, access to knives.  Identified Victim: No person listed.  History of harm to others?: No(Pt denies. ) Assessment of Violence: None Noted Violent Behavior Description: NA Does patient have access to weapons?: Yes (Comment)(Knives. ) Criminal Charges Pending?: No(Pt reported, pending charges then denies having charges. ) Does patient have a court date: No Is patient on probation?: No  Psychosis Hallucinations: None noted(Per IVC however pt denies. ) Delusions: None noted  Mental Status Report Appearance/Hygiene: In scrubs Eye Contact: Fair Motor Activity: Unremarkable Speech: Logical/coherent Level of Consciousness: Quiet/awake, Drowsy Mood: Anxious Affect: Flat Anxiety  Level: Severe Thought Processes: Coherent, Relevant Judgement: Partial Orientation: Person, Time Obsessive Compulsive Thoughts/Behaviors: None  Cognitive Functioning Concentration: Fair Memory: Recent Impaired Is patient IDD: No Insight: Poor Impulse Control: Fair Appetite: Fair Have you had any weight changes? : No Change Sleep: (Per IVC pt has not been sleeping however per pt getting 8 hr) Total Hours of Sleep: (Per IVC pt has not been sleeping however per pt getting 8 hr) Vegetative Symptoms: Staying in bed  ADLScreening [facility] Assessment Services) Patient's cognitive ability adequate to safely complete daily activities?: Yes Patient able to express need for assistance with ADLs?: Yes Independently performs ADLs?: Yes (appropriate for developmental age)  Prior Inpatient Therapy Prior Inpatient Therapy: No  Prior Outpatient Therapy Prior Outpatient Therapy: Yes Prior Therapy Dates: It's been a while."  Prior Therapy Facilty/Provider(s): Dr. Jannifer Franklin. Reason for Treatment: Medication management. Does patient have an ACCT team?: No Does patient have Intensive In-House Services?  : No Does patient have [facility] services? : No Does patient have P4CC services?: No  ADL Screening (condition at time of admission) Patient's cognitive ability adequate to safely complete daily activities?: Yes Is the patient deaf or have difficulty hearing?: No Does the patient have difficulty seeing, even when wearing glasses/contacts?: Yes(Pt reported, wearing glasses.) Does the patient have difficulty concentrating, remembering, or making decisions?: Yes Patient able to express need for assistance with ADLs?: Yes Does the patient have difficulty dressing or bathing?: No Independently performs ADLs?: Yes (appropriate for developmental age) Does the patient have difficulty walking or climbing stairs?: No Weakness of Legs: None Weakness of Arms/Hands: None  Home Assistive Devices/Equipment Home  Assistive Devices/Equipment: Eyeglasses    Abuse/Neglect Assessment (Assessment to be complete while patient is  alone) Abuse/Neglect Assessment Can Be Completed: Yes Physical Abuse: Denies(Pt denies, abuse.) Verbal Abuse: Denies(Pt denies, abuse.) Sexual Abuse: Denies(Pt denies, abuse.) Exploitation of patient/patient's resources: Denies(Pt denies, abuse.) Self-Neglect: Denies(Pt denies, abuse.)     Advance Directives (For Healthcare) Does Patient Have a Medical Advance Directive?: No Nutrition Screen- [facility] Adult/[facility] Patient's home diet: Regular        Disposition: Renaye RakersAdaku Anike, NP recommends inpatient treatment. Otila KluverPerbKim, AC, RN no appropriated beds available. TTS to seek placement.  Disposition Initial Assessment Completed for this Encounter: Yes  This service was provided via telemedicine using a 2-way, interactive audio and video technology.  Names of all persons participating in this telemedicine service and their role in this encounter. Name: Todd Burton. Role: Patient.  Name: Redmond Pullingreylese D Asmaa Tirpak, MS, [facility], [facility]. Role: Counselor.           Redmond Pullingreylese D Jeweline Reif 09/20/2019 10:34 PM    Redmond Pullingreylese D Madai Nuccio, MS, [facility], [facility] Triage Specialist [phone]

## 2019-09-20 NOTE — ED Provider Notes (Signed)
[facility] DEPT Provider Note   CSN: [id number] Arrival date & time: 09/20/19  1130     History   Chief Complaint Chief Complaint  Patient presents with   Psychiatric Evaluation    HPI Todd Burton is a 23 y.o. male with PMHx schizoaffective disorder who presents to the ED under IVC by mother/caregiver. Most of history provided by mother/caregiver who is present with pt in the room -mother reports that patient has been off of his medications for the past week.  She reports that for the last month he has started smoking marijuana and occasionally taking his medicines.  She reports that the last time she saw him was a week ago.  She is unsure where he has been staying but assumes likely a friend.  She is unsure if he has been using any other substances.  She reports that patient's friend called her around 1 AM last night stating that patient appeared to be manic.  Patient showed up at his family house this morning and was crying/intermittently very angry.  He had told mother and other family members that he was hearing voices and they were telling him to kill himself.  Patient then also threatened to kill others including his mother.  Mom therefore called [employer] and placed IVC paperwork.  Reports last time patient had to be hospitalized was about 2.5 years ago.  Mom was able to have patient take all of his medications this morning.    Pt does endorse that he has been hearing voices but does not want to elaborate as to what they are telling him.  He is currently denying any SI/HI.  He denies any other complaints at this time.     The history is provided by the patient and a parent.    Past Medical History:  Diagnosis Date   Obesity    Schizoaffective disorder [facility])     Patient Active Problem List   Diagnosis Date Noted   Proteinuria 07/02/2016   Infectious mononucleosis 06/30/2016   UTI (urinary tract infection) 06/28/2016   Tachycardia 06/28/2016     Morbid obesity ([place]) 06/28/2016   Schizoaffective disorder ([place]) 06/28/2016   Tachypnea 06/28/2016    No past surgical history on file.      Home Medications    Prior to Admission medications   Medication Sig Start Date End Date Taking? Authorizing Provider  amantadine (SYMMETREL) 100 MG capsule Take 100 mg by mouth 2 (two) times daily.   Yes [provider]  ARIPiprazole ER (ABILIFY MAINTENA) 400 MG SUSR Inject 400 mg into the muscle every 30 (thirty) days. Last injection 06/09/16   Yes [provider]  chlorproMAZINE (THORAZINE) 50 MG tablet Take 50-100 mg by mouth 2 (two) times daily. Take 1 tablet (50mg ) every morning and 2 tablets (100mg ) at bedtime. 07/30/19  Yes [provider]  fluPHENAZine (PROLIXIN) 5 MG tablet Take 15 mg by mouth at bedtime.    Yes [provider]  folic acid (FOLVITE) 1 MG tablet Take 1 tablet (1 mg total) by mouth daily. 07/02/16  Yes Johnson, Clanford L, MD  lisinopril (ZESTRIL) 10 MG tablet Take 10 mg by mouth every evening. 07/10/19  Yes [provider]  topiramate (TOPAMAX) 100 MG tablet Take 100 mg by mouth every evening. 09/10/19  Yes [provider]  trazodone (DESYREL) 300 MG tablet Take 300 mg by mouth at bedtime.   Yes [provider]  Vitamin D, Ergocalciferol, (DRISDOL) 1.25 MG (50000 UT)  CAPS capsule Take 50,000 Units by mouth once a week. 07/10/19  Yes [provider]  clotrimazole (LOTRIMIN) 1 % cream Apply topically 2 (two) times daily. Patient not taking: Reported on 09/20/2019 07/02/16   Cleora Fleet, MD    Family History Family History  Problem Relation Age of Onset   Pancreatic cancer Maternal Grandfather    Pancreatic cancer Paternal Grandmother     Social History Social History   Tobacco Use   Smoking status: Current Every Day Smoker   Smokeless tobacco: Never Used  Substance Use Topics   Alcohol use: Yes    Comment: weekend   Drug use: No      Allergies   Patient has no known allergies.   Review of Systems Review of Systems  Musculoskeletal: Negative for arthralgias and myalgias.  Psychiatric/Behavioral: Positive for hallucinations, sleep disturbance and suicidal ideas.  All other systems reviewed and are negative.    Physical Exam Updated Vital Signs BP (!) 148/75 (BP Location: Left Arm)    Pulse (!) 130    Temp 98.3 F (36.8 C) (Oral)    Resp 16    Wt (!) 159.2 kg    SpO2 100%    BMI 47.60 kg/m   Physical Exam Vitals signs and nursing note reviewed.  Constitutional:      Appearance: He is obese. He is not ill-appearing or diaphoretic.  HENT:     Head: Normocephalic and atraumatic.  Eyes:     Conjunctiva/sclera: Conjunctivae normal.  Neck:     Musculoskeletal: Normal range of motion and neck supple.  Cardiovascular:     Rate and Rhythm: Regular rhythm. Tachycardia present.  Pulmonary:     Effort: Pulmonary effort is normal.     Breath sounds: Normal breath sounds. No wheezing, rhonchi or rales.  Chest:     Chest wall: No tenderness.  Abdominal:     Palpations: Abdomen is soft.     Tenderness: There is no abdominal tenderness. There is no guarding or rebound.  Musculoskeletal:        General: No tenderness.     Right lower leg: No edema.     Left lower leg: No edema.  Skin:    General: Skin is warm and dry.  Neurological:     Mental Status: He is alert.  Psychiatric:        Attention and Perception: He perceives auditory hallucinations.        Mood and Affect: Affect is angry.      ED Treatments / Results  Labs (all labs ordered are listed, but only abnormal results are displayed) Labs Reviewed  COMPREHENSIVE METABOLIC PANEL - Abnormal; Notable for the following components:      Result Value   CO2 21 (*)    Glucose, Bld 106 (*)    Creatinine, Ser 1.48 (*)    Total Bilirubin 1.5 (*)    All other components within normal limits  CBC WITH DIFFERENTIAL/PLATELET - Abnormal; Notable for the  following components:   WBC 17.2 (*)    MCV 101.5 (*)    Neutro Abs 13.1 (*)    Monocytes Absolute 1.1 (*)    All other components within normal limits  ACETAMINOPHEN LEVEL - Abnormal; Notable for the following components:   Acetaminophen (Tylenol), Serum <10 (*)    All other components within normal limits  SARS CORONAVIRUS 2 (TAT 6-24 HRS)  ETHANOL  SALICYLATE LEVEL  LACTIC ACID, PLASMA  LACTIC ACID, PLASMA  RAPID URINE DRUG SCREEN,  HOSP PERFORMED  URINALYSIS, ROUTINE W REFLEX MICROSCOPIC    EKG None  Radiology No results found.  Procedures Procedures (including critical care time)  Medications Ordered in ED Medications  sodium chloride 0.9 % bolus 1,000 mL (0 mLs Intravenous Stopped 09/20/19 1615)     Initial Impression / Assessment and Plan / ED Course  I have reviewed the triage vital signs and the nursing notes.  Pertinent labs & imaging results that were available during my care of the patient were reviewed by me and considered in my medical decision making (see chart for details).    23 year old male with history of schizoaffective disorder who presents to the ED under IVC placed by mother today.  Has been off of meds for 1 week.  Has been living with a friend and smoking marijuana, mother is unsure of other substances.  Reports that he came to their house today and.  To be in manic episode.  Patient complained of auditory hallucinations and stated they told him to kill himself.  Also there another family members including his mother.  Currently denying SI/HI.  He does endorse that he is having auditory hallucinations but does not elaborate.  Patient's heart rate elevated in the 130s today.  Mom reports that he is chronically elevated and this does appear consistent on chart review.  Mom reports that he had a stress test approximately 2 years ago with no findings.  She is unsure if his medications are causing his heart rate to be elevated.  No history of DVT/PE.   Currently denying any chest pain or shortness of breath.  No lower extremity edema or tenderness.  Vital signs otherwise stable.  Patient afebrile without tachypnea.  Will recheck this.  Patient to be medically cleared prior to psych eval.   Pulse recheck - 119. It does appear pt has been chronically tachycardic. He does have elevated leukocytosis today at 17.2. I have reviewed past labs - pt also has chronic elevation in WBC count. Will add on lactic at this time and give IVFs. Pt without any infectious symptoms today - may be tachycardic due to drug use as well - mom unsure of other substances besides marijuana but she does seem concerned. Pt does have hx of UTI as well - awaiting urinalysis.   WBC  Date Value Ref Range Status  09/20/2019 17.2 (H) 4.0 - 10.5 K/uL Final  07/15/2016 11.2 (H) 4.0 - 10.5 K/uL Final  07/02/2016 21.2 (H) 4.0 - 10.5 K/uL Final  07/01/2016 20.0 (H) 4.0 - 10.5 K/uL Final   Creatinine elevated although at baseline today.   Lab Results  Component Value Date   CREATININE 1.48 (H) 09/20/2019   CREATININE 2.18 (H) 07/15/2016   CREATININE 1.45 (H) 07/02/2016   Lactic acid within normal limits. Still awaiting urinalysis and UDS although do not feel this will change treatment course. Pt medically cleared at this time. Awaiting TTS evaluation.       Final Clinical Impressions(s) / ED Diagnoses   Final diagnoses:  Suicidal ideation  Schizoaffective disorder, unspecified type [facility](HCC)    ED Discharge Orders    None       Tanda RockersVenter, Pilar Westergaard, PA-C 09/20/19 2130    Linwood DibblesKnapp, Jon, MD 09/21/19 [phone]

## 2019-09-20 NOTE — ED Notes (Signed)
Pt to room 36. Pt calm, cooperative, no s/s of distress. Pt oriented to unit. Pt guarded , quiet, resting in bed. Pt states everything ok.

## 2019-09-20 NOTE — BHH Counselor (Addendum)
Pt's mother Todd Burton, [phone]) IVC'd the pt. Per IVC paperwork: "Diagnosed Schizophrenia, Major Depression. Uncontrollable crying then switches to outbursts of anger, then to threatening to harm himself or someone else. Has been without med's for 4 days. Also no sleep. Mother did get 1 dose in this morning while police were presents-they left and he took medicine deal. He thinks someone at apartment complex is harming mother and this is why he wants to hurt people. Going on for month-month and a half."   Clinician spoke to the pt's mother/IVC petitioner to obtain additional information. Per mother, the pt's psychosis has started about two months ago (on and off). Per mother, she feels it's substance induce however the pt denied use. Per mother, the pt has not slept in a couple days, she feels he's depressed because he's living at home and wants to be more independent. Per mother, she wants to the pt to have an ACT Team. Pt's mother reported, the pt has not taken his medications in a week. Per mother, the pt takes an Ability injection monthly (last injection was in October 2020). Per mother, this morning the pt was "talking out the side of his head," saying people in complex are going to kill him. Pt's mother reported, the pt became combative then started crying. Per mother, the pt had her car keys threatening to harm their neighbor. Per mother the pt said, "I'm gonna kill them they already killed me, I'm just gonna kill myself, end it all." Per mother, she then called the police. Pt's mother reported, she feels safe if the pt was to be discharged.     Per mother she has medical POA (is also the pt's payee) of the pt. Clinician asked the mother to bring in documentation to state she has medical POA of the pt.   Vertell Novak, [place], [facility], [facility] Triage Specialist [phone]

## 2019-09-20 NOTE — BHH Counselor (Signed)
At 2051, clinician called and spoke to Junior, RN to ask if he could fax pt's IVC paperwork. Once received and reviewed she will complete pt's TTS assessment.    Vertell Novak, [place], [facility], [facility] Triage Specialist [phone]

## 2019-09-21 ENCOUNTER — Encounter (HOSPITAL_COMMUNITY): Payer: Self-pay | Admitting: Registered Nurse

## 2019-09-21 DIAGNOSIS — R45851 Suicidal ideations: Secondary | ICD-10-CM | POA: Insufficient documentation

## 2019-09-21 DIAGNOSIS — F1994 Other psychoactive substance use, unspecified with psychoactive substance-induced mood disorder: Secondary | ICD-10-CM | POA: Diagnosis not present

## 2019-09-21 DIAGNOSIS — F259 Schizoaffective disorder, unspecified: Secondary | ICD-10-CM | POA: Diagnosis not present

## 2019-09-21 LAB — URINALYSIS, ROUTINE W REFLEX MICROSCOPIC
Bilirubin Urine: NEGATIVE
Glucose, UA: NEGATIVE mg/dL
Hgb urine dipstick: NEGATIVE
Ketones, ur: 20 mg/dL — AB
Leukocytes,Ua: NEGATIVE
Nitrite: NEGATIVE
Protein, ur: 30 mg/dL — AB
Specific Gravity, Urine: 1.028 (ref 1.005–1.030)
pH: 5 (ref 5.0–8.0)

## 2019-09-21 LAB — RAPID URINE DRUG SCREEN, HOSP PERFORMED
Amphetamines: NOT DETECTED
Barbiturates: NOT DETECTED
Benzodiazepines: NOT DETECTED
Cocaine: POSITIVE — AB
Opiates: NOT DETECTED
Tetrahydrocannabinol: POSITIVE — AB

## 2019-09-21 LAB — SARS CORONAVIRUS 2 (TAT 6-24 HRS): SARS Coronavirus 2: NEGATIVE

## 2019-09-21 NOTE — BHH Counselor (Signed)
Pt was reassessed/First Opinion by Ermelinda Das, MD.  Pt was oriented.  He denied suicidal and homicidal ideation.  Pt acknowledged a history of auditory hallucination, but stated also ''they don't mean anything.''  Pt denied current auditory hallucination.  Pt also denied visual hallucination.  There was no evidence of delusion.  Pt's UDS indicated the presence of cocaine and marijuana.  Pt endorsed understanding that these substances can trigger or exacerbate his symptoms.  Pt expressed a willingness to follow-up with his outpatient provider Dan Europe, MD) and to pursue substance use counseling.  Pt is psych-cleared.

## 2019-09-21 NOTE — BHH Counselor (Signed)
Spoke to Junior, Therapist, sports to discuss pt's disposition. RN to discuss disposition with EDP.   Clinician attempted to call EDP to discuss pt's disposition however she was immediately placed on hold.     Vertell Novak, [place], [facility], [facility] Triage Specialist [phone]

## 2019-09-21 NOTE — ED Provider Notes (Signed)
Vitals:   09/20/19 2132 09/21/19 0609  BP: 135/64 116/73  Pulse: (!) 106 (!) 112  Resp: 16 19  Temp: 97.6 F (36.4 C) 98 F (36.7 C)  SpO2: 100% 97%   Patient noted to have persistent tachycardia.  This appears to be patient's baseline.  reMains afebrile.  Medically stable.  Pending psychiatric disposition.   Dorie Rank, MD 09/21/19 [phone]

## 2019-09-21 NOTE — Discharge Instructions (Signed)
For your behavioral health needs, you are advised to continue treatment with Mojeed Akintayo, MD: ° °     Mojeed Akintayo, MD °     [facility] °     [address] °     [place], [place] [address] °     [phone] °

## 2019-09-21 NOTE — BH Assessment (Signed)
[facility] Assessment Progress Note  Per Shuvon Rankin, FNP, this pt does not require psychiatric hospitalization at this time.  Pt presents under IVC initiated by pt's mother with has been rescinded by Krystal Eaton, [facility].  Pt is to be discharged from [facility] with recommendation to continue treatment at the [facility].  This has been included in pt's discharge instructions.  Pt's nurse has been notified.  Jalene Mullet, [employer] Triage Specialist [phone]

## 2019-09-21 NOTE — ED Notes (Signed)
Pt mother called and stated she had spoken with someone late last night regarding her son's treatment. Pt mother said she was asked if she was afraid of pt coming home and she said "I'm not afraid of him coming home, I just want him to be stabilized on his medications. I feel as though he could benefit from the ACT team".

## 2019-09-21 NOTE — Consult Note (Signed)
[facility]  09/21/2019 12:53 PM Todd BanningColby Burton  MRN:  [id number] Principal Problem: Substance induced mood disorder [facility](HCC) Discharge Diagnoses: Principal Problem:   Substance induced mood disorder (HCC)   Subjective: Todd Banningolby Burton, 23 y.o., male patient seen via tele psych by this provider, Dr. Lucianne MussKumar; and chart reviewed on 09/21/19.  On evaluation Todd BanningColby Burton reports they came to the hospital because he was having some psychosis related to drugs.  Patient states that he has gotten his head together and he is feeling better this morning.  At this time patient denies suicidal/self-harm/homicidal ideations, psychosis, paranoia.  Patient reports that he lives with his mother and that he currently has outpatient psychiatric services with Dr. Jannifer FranklinAkintayo and that he is compliant with his medications.  Patient gave permission to speak to his mother for collateral information. During evaluation Todd Burton is alert/oriented x 4; calm/cooperative; and mood is congruent with affect.  He does not appear to be responding to internal/external stimuli or delusional thoughts.  Patient denies suicidal/self-harm/homicidal ideation, psychosis, and paranoia.  Patient answered question appropriately.   Attempted to call patient's mother Todd Burton at [phone] and got no answer.  Total Time spent with patient: 30 minutes  Past Psychiatric History: Schizoaffective disorder, substance-induced mood disorder.  Past Medical History:  Past Medical History:  Diagnosis Date  . Obesity   . Schizoaffective disorder (HCC)    History reviewed. No pertinent surgical history. Family History:  Family History  Problem Relation Age of Onset  . Pancreatic cancer Maternal Grandfather   . Pancreatic cancer Paternal Grandmother    Family Psychiatric  History: Denies Social History:  Social History   Substance and Sexual Activity  Alcohol Use Yes   Comment: weekend     Social History   Substance and Sexual  Activity  Drug Use No    Social History   Socioeconomic History  . Marital status: Single    Spouse name: Not on file  . Number of children: Not on file  . Years of education: Not on file  . Highest education level: Not on file  Occupational History  . Not on file  Social Needs  . Financial resource strain: Not on file  . Food insecurity    Worry: Not on file    Inability: Not on file  . Transportation needs    Medical: Not on file    Non-medical: Not on file  Tobacco Use  . Smoking status: Current Every Day Smoker  . Smokeless tobacco: Never Used  Substance and Sexual Activity  . Alcohol use: Yes    Comment: weekend  . Drug use: No  . Sexual activity: Not on file  Lifestyle  . Physical activity    Days per week: Not on file    Minutes per session: Not on file  . Stress: Not on file  Relationships  . Social Musicianconnections    Talks on phone: Not on file    Gets together: Not on file    Attends religious service: Not on file    Active member of club or organization: Not on file    Attends meetings of clubs or organizations: Not on file    Relationship status: Not on file  Other Topics Concern  . Not on file  Social History Narrative  . Not on file    Has this patient used any form of tobacco in the last 30 days? (Cigarettes, Smokeless Tobacco, Cigars, and/or Pipes) A prescription for an FDA-approved tobacco cessation medication  was offered at discharge and the patient refused  Current Medications: Current Facility-Administered Medications  Medication Dose Route Frequency Provider Last Rate Last Dose  . amantadine (SYMMETREL) capsule 100 mg  100 mg Oral BID Alroy Bailiff, Margaux, PA-C   100 mg at 09/21/19 1035  . chlorproMAZINE (THORAZINE) tablet 50-100 mg  50-100 mg Oral BID Alroy Bailiff, Margaux, PA-C   50 mg at 09/21/19 1035  . fluPHENAZine (PROLIXIN) tablet 15 mg  15 mg Oral QHS Venter, Margaux, PA-C   15 mg at 09/20/19 2116  . lisinopril (ZESTRIL) tablet 10 mg  10 mg Oral  QPM Venter, Margaux, PA-C   10 mg at 09/20/19 2115  . topiramate (TOPAMAX) tablet 100 mg  100 mg Oral QPM Alroy Bailiff, Margaux, PA-C   100 mg at 09/20/19 2115  . traZODone (DESYREL) tablet 300 mg  300 mg Oral QHS Alroy Bailiff, Margaux, PA-C   300 mg at 09/20/19 2115   Current Outpatient Medications  Medication Sig Dispense Refill  . amantadine (SYMMETREL) 100 MG capsule Take 100 mg by mouth 2 (two) times daily.    . ARIPiprazole ER (ABILIFY MAINTENA) 400 MG SUSR Inject 400 mg into the muscle every 30 (thirty) days. Last injection 06/09/16    . chlorproMAZINE (THORAZINE) 50 MG tablet Take 50-100 mg by mouth 2 (two) times daily. Take 1 tablet (50mg ) every morning and 2 tablets (100mg ) at bedtime.    . fluPHENAZine (PROLIXIN) 5 MG tablet Take 15 mg by mouth at bedtime.     . folic acid (FOLVITE) 1 MG tablet Take 1 tablet (1 mg total) by mouth daily. 30 tablet 0  . lisinopril (ZESTRIL) 10 MG tablet Take 10 mg by mouth every evening.    . topiramate (TOPAMAX) 100 MG tablet Take 100 mg by mouth every evening.    . trazodone (DESYREL) 300 MG tablet Take 300 mg by mouth at bedtime.    . Vitamin D, Ergocalciferol, (DRISDOL) 1.25 MG (50000 UT) CAPS capsule Take 50,000 Units by mouth once a week.    . clotrimazole (LOTRIMIN) 1 % cream Apply topically 2 (two) times daily. (Patient not taking: Reported on 09/20/2019) 30 g 0   PTA Medications: (Not in a hospital admission)   Musculoskeletal: Strength & Muscle Tone: within normal limits Gait & Station: normal Patient leans: N/A  Psychiatric Specialty Exam: Physical Exam  Nursing note and vitals reviewed. Constitutional: He is oriented to person, place, and time. No distress.  Neck: Normal range of motion.  Respiratory: Effort normal.  Musculoskeletal: Normal range of motion.  Neurological: He is alert and oriented to person, place, and time.  Psychiatric: He has a normal mood and affect. His speech is normal and behavior is normal. Judgment and thought content  normal. Cognition and memory are normal.    Review of Systems  Psychiatric/Behavioral: Negative for depression, hallucinations, memory loss, substance abuse and suicidal ideas. The patient is not nervous/anxious and does not have insomnia.   All other systems reviewed and are negative.   Blood pressure 128/64, pulse (!) 124, temperature 97.6 F (36.4 C), temperature source Oral, resp. rate 18, weight (!) 159.2 kg, SpO2 97 %.Body mass index is 47.6 kg/m.  General Appearance: Casual  Eye Contact:  Good  Speech:  Clear and Coherent and Normal Rate  Volume:  Normal  Mood:  Appropriate  Affect:  Appropriate and Congruent  Thought Process:  Coherent, Goal Directed and Descriptions of Associations: Intact  Orientation:  Full (Time, Place, and Person)  Thought Content:  WDL  Suicidal Thoughts:  No  Homicidal Thoughts:  No  Memory:  Immediate;   Good Recent;   Good  Judgement:  Intact  Insight:  Present  Psychomotor Activity:  Normal  Concentration:  Concentration: Good and Attention Span: Good  Recall:  Good  Fund of Knowledge:  Good  Language:  Good  Akathisia:  No  Handed:  Right  AIMS (if indicated):     Assets:  Communication Skills Desire for Improvement Housing Physical Health Social Support  ADL's:  Intact  Cognition:  WNL  Sleep:        Demographic Factors:  Male  Loss Factors: NA  Historical Factors: NA  Risk Reduction Factors:   Sense of responsibility to family, Living with another person, especially a relative, Positive social support and Positive therapeutic relationship  Continued Clinical Symptoms:  Alcohol/Substance Abuse/Dependencies Previous Psychiatric Diagnoses and Treatments  Cognitive Features That Contribute To Risk:  None    Suicide Risk:  Minimal: No identifiable suicidal ideation.  Patients presenting with no risk factors but with morbid ruminations; may be classified as minimal risk based on the severity of the depressive  symptoms   Plan Of Care/Follow-up recommendations:  Activity:  Astolerated Diet:  Heart healthy Other:  Follow up with Dr. Natalia Leatherwood     Disposition: No evidence of imminent risk to self or others at present.   Patient does not meet criteria for psychiatric inpatient admission. Supportive therapy provided about ongoing stressors. Discussed crisis plan, support from social network, calling 911, coming to the Emergency Department, and calling Suicide Hotline.  Viaan Knippenberg, NP 09/21/2019, 12:53 PM

## 2019-09-21 NOTE — Patient Outreach (Signed)
ED Peer Support Specialist Patient Intake (Complete at intake & 30-60 Day Follow-up)  Name: Todd Burton  MRN: [id number]  Age: 23 y.o.   Date of Admission: 09/21/2019  Intake: Initial Comments:      Primary Reason Admitted: Psychiatric Evaluation   Lab values: Alcohol/ETOH: Not completed Positive UDS? Drug screen not completed Amphetamines: Drug screen not completed Barbiturates: Drug screen not completed Benzodiazepines: Drug screen not completed Cocaine: Drug screen not completed Opiates: Drug screen not completed Cannabinoids: Drug screen not completed  Demographic information: Gender: Male Ethnicity: African American Marital Status: Single Insurance Status: Diplomatic Services operational officer (Work Neurosurgeon, Physicist, medical, etc.: No Lives with: Partner/Spouse Living situation: House/Apartment  Reported Patient History: Patient reported health conditions: Schizoaffective disorder, Schizophrenia Patient aware of HIV and hepatitis status: No  In past year, has patient visited ED for any reason? No  Number of ED visits:    Reason(s) for visit:    In past year, has patient been hospitalized for any reason? No  Number of hospitalizations:    Reason(s) for hospitalization:    In past year, has patient been arrested? No  Number of arrests:    Reason(s) for arrest:    In past year, has patient been incarcerated? No  Number of incarcerations:    Reason(s) for incarceration:    In past year, has patient received medication-assisted treatment? No  In past year, patient received the following treatments: Other (comment)  In past year, has patient received any harm reduction services? No  Did this include any of the following?    In past year, has patient received care from a mental health provider for diagnosis other than SUD? No  In past year, is this first time patient has overdosed? No  Number of past overdoses:    In past year,  is this first time patient has been hospitalized for an overdose? No  Number of hospitalizations for overdose(s):    Is patient currently receiving treatment for a mental health diagnosis? Yes  Patient reports experiencing difficulty participating in SUD treatment: No    Most important reason(s) for this difficulty?    Has patient received prior services for treatment? No  In past, patient has received services from following agencies:    Plan of Care:  Suggested follow up at these agencies/treatment centers: Other (comment)  Other information: [facility] met with Pt an was made aware that Pt has been using and that his mother wants for him to seek help for his substance Abuse. Pt stated that he can stop whenever he feels like it. Pt also addressed the fact that he has severe Schizophrenia and that it is what is bothering him more then anything. Pt addressed the fact he does think about stopping and was asking questions on what he needs to do at this time.Pt stated that he has way to much to deal with at home to an he will think about seeking help after he takes care of what he has to deal with. [facility] gave Pt a list of facilities that Pt can attend with his insurance, an also gave Pt contact information to reach [facility] if an when he is ready to take the steps to better the quality of his life.    Aaron Edelman Tytianna Greenley, C[facility]  09/21/2019 11:56 AM

## 2019-11-04 DEATH — deceased
